# Patient Record
Sex: Female | Born: 1965 | Race: White | Hispanic: No | Marital: Married | Smoking: Never smoker
Health system: Southern US, Community
[De-identification: ages and names within clinical notes are randomized; demographics above are authoritative.]

## PROBLEM LIST (undated history)

## (undated) DIAGNOSIS — M797 Fibromyalgia: Secondary | ICD-10-CM

## (undated) DIAGNOSIS — M47812 Spondylosis without myelopathy or radiculopathy, cervical region: Secondary | ICD-10-CM

## (undated) DIAGNOSIS — F32A Depression, unspecified: Secondary | ICD-10-CM

## (undated) DIAGNOSIS — I7300 Raynaud's syndrome without gangrene: Secondary | ICD-10-CM

## (undated) DIAGNOSIS — N6019 Diffuse cystic mastopathy of unspecified breast: Secondary | ICD-10-CM

## (undated) DIAGNOSIS — M1990 Unspecified osteoarthritis, unspecified site: Secondary | ICD-10-CM

## (undated) DIAGNOSIS — F329 Major depressive disorder, single episode, unspecified: Secondary | ICD-10-CM

## (undated) DIAGNOSIS — I10 Essential (primary) hypertension: Secondary | ICD-10-CM

## (undated) DIAGNOSIS — K635 Polyp of colon: Secondary | ICD-10-CM

## (undated) HISTORY — DX: Depression, unspecified: F32.A

## (undated) HISTORY — DX: Major depressive disorder, single episode, unspecified: F32.9

## (undated) HISTORY — DX: Raynaud's syndrome without gangrene: I73.00

## (undated) HISTORY — PX: LAPAROTOMY: SHX154

## (undated) HISTORY — DX: Diffuse cystic mastopathy of unspecified breast: N60.19

## (undated) HISTORY — DX: Essential (primary) hypertension: I10

## (undated) HISTORY — DX: Polyp of colon: K63.5

## (undated) HISTORY — DX: Spondylosis without myelopathy or radiculopathy, cervical region: M47.812

## (undated) HISTORY — PX: WRIST SURGERY: SHX841

## (undated) HISTORY — PX: MYOMECTOMY: SHX85

## (undated) HISTORY — DX: Unspecified osteoarthritis, unspecified site: M19.90

## (undated) HISTORY — DX: Fibromyalgia: M79.7

---

## 1998-03-16 ENCOUNTER — Other Ambulatory Visit: Admission: RE | Admit: 1998-03-16 | Discharge: 1998-03-16 | Payer: Self-pay | Admitting: Obstetrics and Gynecology

## 1999-03-20 ENCOUNTER — Other Ambulatory Visit: Admission: RE | Admit: 1999-03-20 | Discharge: 1999-03-20 | Payer: Self-pay | Admitting: Obstetrics and Gynecology

## 1999-04-06 ENCOUNTER — Emergency Department (HOSPITAL_COMMUNITY): Admission: EM | Admit: 1999-04-06 | Discharge: 1999-04-06 | Payer: Self-pay | Admitting: Emergency Medicine

## 1999-04-06 ENCOUNTER — Encounter: Payer: Self-pay | Admitting: Emergency Medicine

## 1999-06-02 ENCOUNTER — Ambulatory Visit (HOSPITAL_COMMUNITY): Admission: RE | Admit: 1999-06-02 | Discharge: 1999-06-02 | Payer: Self-pay | Admitting: Orthopedic Surgery

## 1999-06-02 ENCOUNTER — Encounter: Payer: Self-pay | Admitting: Orthopedic Surgery

## 1999-07-07 ENCOUNTER — Ambulatory Visit (HOSPITAL_BASED_OUTPATIENT_CLINIC_OR_DEPARTMENT_OTHER): Admission: RE | Admit: 1999-07-07 | Discharge: 1999-07-07 | Payer: Self-pay | Admitting: Orthopedic Surgery

## 1999-08-24 ENCOUNTER — Encounter: Admission: RE | Admit: 1999-08-24 | Discharge: 1999-08-24 | Payer: Self-pay | Admitting: Internal Medicine

## 1999-08-24 ENCOUNTER — Encounter: Payer: Self-pay | Admitting: Internal Medicine

## 2000-03-20 ENCOUNTER — Other Ambulatory Visit: Admission: RE | Admit: 2000-03-20 | Discharge: 2000-03-20 | Payer: Self-pay | Admitting: Obstetrics and Gynecology

## 2001-02-14 ENCOUNTER — Encounter: Payer: Self-pay | Admitting: Emergency Medicine

## 2001-02-14 ENCOUNTER — Emergency Department (HOSPITAL_COMMUNITY): Admission: EM | Admit: 2001-02-14 | Discharge: 2001-02-14 | Payer: Self-pay | Admitting: Emergency Medicine

## 2001-04-01 ENCOUNTER — Other Ambulatory Visit: Admission: RE | Admit: 2001-04-01 | Discharge: 2001-04-01 | Payer: Self-pay | Admitting: Obstetrics and Gynecology

## 2001-04-03 ENCOUNTER — Encounter: Admission: RE | Admit: 2001-04-03 | Discharge: 2001-04-03 | Payer: Self-pay | Admitting: Obstetrics and Gynecology

## 2001-04-03 ENCOUNTER — Encounter: Payer: Self-pay | Admitting: Obstetrics and Gynecology

## 2002-04-10 ENCOUNTER — Other Ambulatory Visit: Admission: RE | Admit: 2002-04-10 | Discharge: 2002-04-10 | Payer: Self-pay | Admitting: Obstetrics and Gynecology

## 2002-07-16 ENCOUNTER — Encounter: Payer: Self-pay | Admitting: Internal Medicine

## 2002-07-16 ENCOUNTER — Encounter: Admission: RE | Admit: 2002-07-16 | Discharge: 2002-07-16 | Payer: Self-pay | Admitting: Internal Medicine

## 2003-04-28 ENCOUNTER — Other Ambulatory Visit: Admission: RE | Admit: 2003-04-28 | Discharge: 2003-04-28 | Payer: Self-pay | Admitting: Obstetrics and Gynecology

## 2004-05-09 ENCOUNTER — Other Ambulatory Visit: Admission: RE | Admit: 2004-05-09 | Discharge: 2004-05-09 | Payer: Self-pay | Admitting: Obstetrics and Gynecology

## 2005-08-14 ENCOUNTER — Emergency Department (HOSPITAL_COMMUNITY): Admission: EM | Admit: 2005-08-14 | Discharge: 2005-08-14 | Payer: Self-pay | Admitting: Emergency Medicine

## 2005-08-23 ENCOUNTER — Other Ambulatory Visit: Admission: RE | Admit: 2005-08-23 | Discharge: 2005-08-23 | Payer: Self-pay | Admitting: Obstetrics and Gynecology

## 2005-11-05 HISTORY — PX: LAPAROSCOPIC VAGINAL HYSTERECTOMY: SUR798

## 2006-04-02 ENCOUNTER — Encounter: Admission: RE | Admit: 2006-04-02 | Discharge: 2006-04-02 | Payer: Self-pay | Admitting: Internal Medicine

## 2007-01-06 ENCOUNTER — Encounter: Admission: RE | Admit: 2007-01-06 | Discharge: 2007-01-06 | Payer: Self-pay | Admitting: Obstetrics and Gynecology

## 2007-02-20 ENCOUNTER — Ambulatory Visit (HOSPITAL_COMMUNITY): Admission: RE | Admit: 2007-02-20 | Discharge: 2007-02-21 | Payer: Self-pay | Admitting: Obstetrics and Gynecology

## 2007-02-20 ENCOUNTER — Encounter (INDEPENDENT_AMBULATORY_CARE_PROVIDER_SITE_OTHER): Payer: Self-pay | Admitting: Specialist

## 2007-11-18 ENCOUNTER — Emergency Department (HOSPITAL_COMMUNITY): Admission: EM | Admit: 2007-11-18 | Discharge: 2007-11-19 | Payer: Self-pay | Admitting: Emergency Medicine

## 2008-01-09 ENCOUNTER — Encounter: Admission: RE | Admit: 2008-01-09 | Discharge: 2008-01-09 | Payer: Self-pay | Admitting: Obstetrics and Gynecology

## 2009-01-11 ENCOUNTER — Encounter: Admission: RE | Admit: 2009-01-11 | Discharge: 2009-01-11 | Payer: Self-pay | Admitting: Obstetrics and Gynecology

## 2009-11-21 IMAGING — CR DG FOOT 2V*L*
2 series · 2 of 2 positions shown · non-contrast
Comparison: none

CLINICAL DATA: Ankle and foot twisting injury.  Pain. 
 LEFT ANKLE - 3 VIEW:

[t foot ap left *]
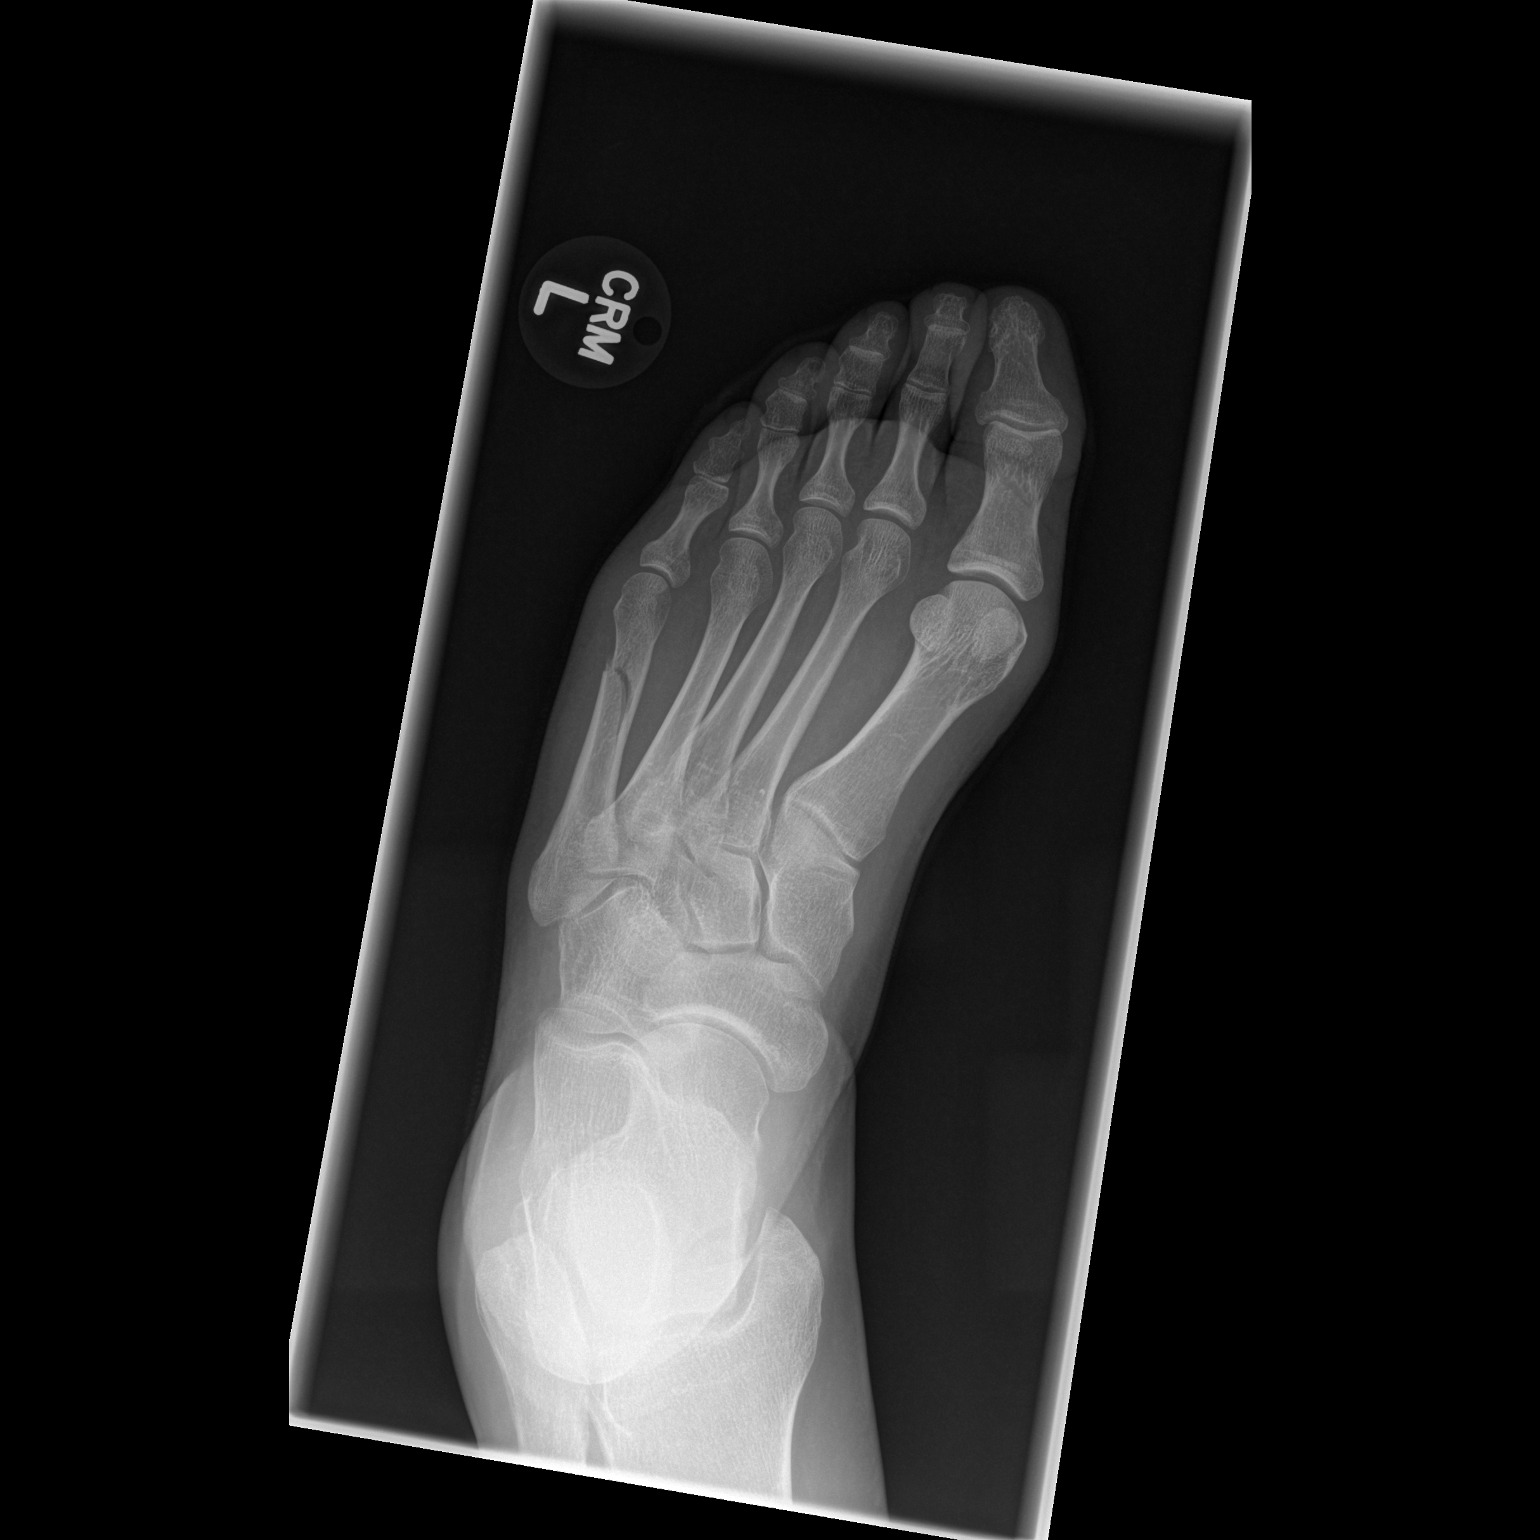

[t foot lat left]
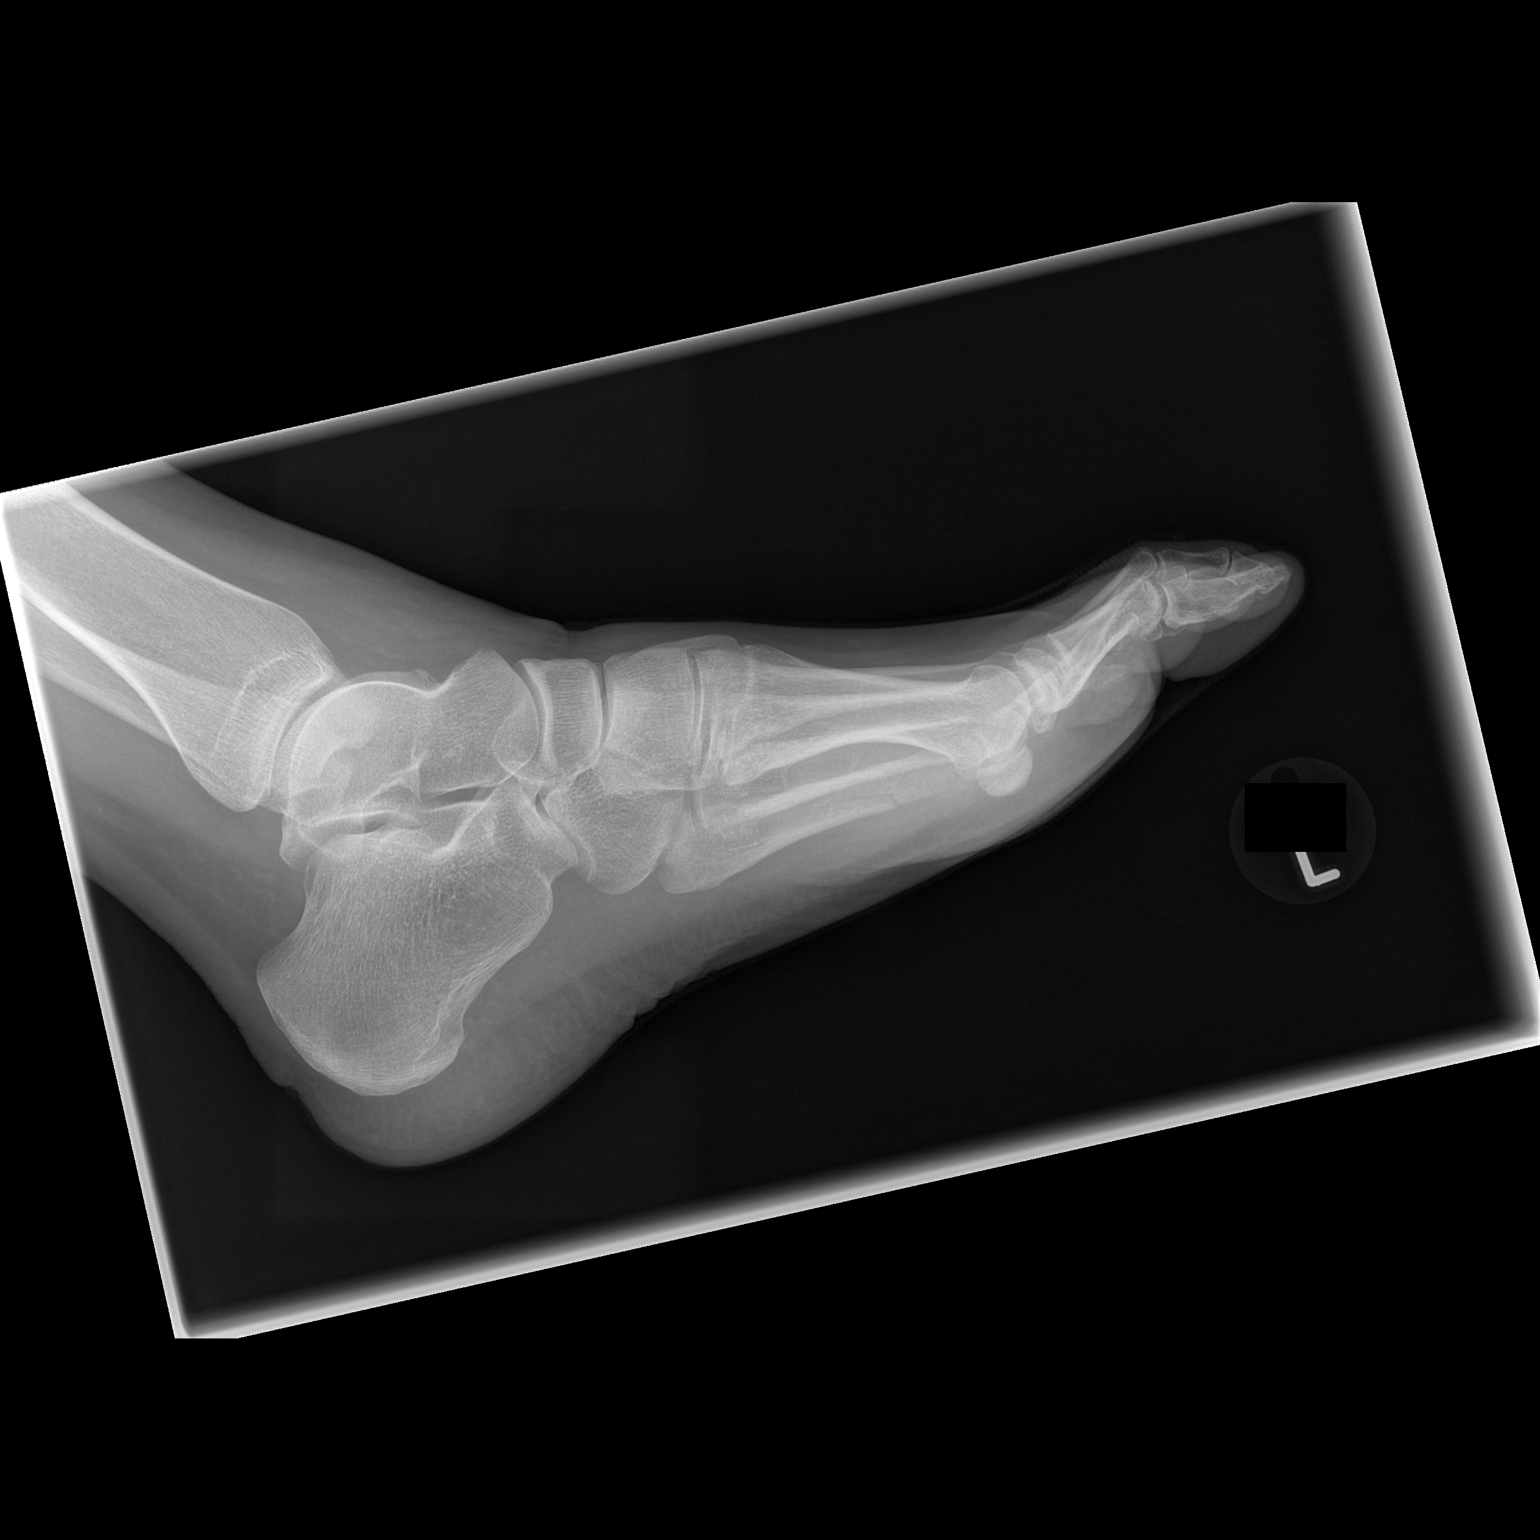

[2 of 2 positions shown; findings below may reference images not displayed]

FINDINGS: Soft tissue swelling is seen along the anterior aspect of the ankle joint, but there is no evidence of fracture or dislocation.  There is no evidence of ankle joint effusion.
IMPRESSION: Anterolateral soft tissue swelling.  No evidence of ankle fracture or dislocation. 
 LEFT FOOT - 2 VIEW:
FINDINGS: A minimally displaced fracture of the distal 5th metatarsal shaft is seen.  No other fractures are identified, and alignment of the bones remains normal.
IMPRESSION: Mildly displaced fracture of the distal 5th metatarsal shaft.

## 2010-10-27 ENCOUNTER — Ambulatory Visit: Payer: Self-pay

## 2010-11-13 ENCOUNTER — Encounter
Admission: RE | Admit: 2010-11-13 | Discharge: 2010-11-13 | Payer: Self-pay | Source: Home / Self Care | Attending: Obstetrics and Gynecology | Admitting: Obstetrics and Gynecology

## 2010-12-15 ENCOUNTER — Encounter: Payer: Self-pay | Admitting: Internal Medicine

## 2011-02-06 NOTE — Procedures (Signed)
Summary: Summary Report  Summary Report   Imported By: Erle Crocker 02/02/2011 10:36:26  _____________________________________________________________________  External Attachment:    Type:   Image     Comment:   External Document

## 2011-03-23 NOTE — H&P (Signed)
NAME:  Natasha Stephens, Natasha Stephens               ACCOUNT NO.:  [id number]   MEDICAL RECORD NO.:  [id number]          PATIENT TYPE:  AMB   LOCATION:  SDC                           FACILITY:  [facility]   PHYSICIAN:  Guy Sandifer. Henderson Cloud, M.D. DATE OF BIRTH:  05/14/1966   DATE OF ADMISSION:  DATE OF DISCHARGE:                              HISTORY & PHYSICAL   CHIEF COMPLAINT:  Heavy crampy menses.   HISTORY OF PRESENT ILLNESS:  This patient is a 45 year old married white  female, G0, P0, with a history of uterine myomectomy in 1996, who is  having current heavy painful menses.  It has been going on for months  and gets progressively heavier.  Ultrasound in my office on December 25, 2006 reveals uterus measuring 8.1 x 4.3 x 5.8 cm.  There is a 6-cm  subserosal fibroid on the uterus.  Left ovary had a 1.6-cm simple cyst.  After careful discussion of the options, patient is being admitted for  laparoscopically-assisted vaginal hysterectomy.  Patient also requests  bilateral salpingo-oophorectomy.  The issues of menopause and possible  treatments have been carefully discussed with the patient.  She would  like both ovaries removed.  Potential risks and complications of the  surgery have also been reviewed.  Patient is a TE[employer] teacher [employer] and  refuses any blood products, although she would accept blood expanders.   PAST MEDICAL HISTORY:  1. Elevated prolactin with a normal MRI of the sella.  2. Endometriosis.  3. History of leiomyomata.  4. Fibrocystic breast change.  5. History of migraine headache.  6. Hypertension.  7. History of depression.   PAST SURGICAL HISTORY:  Abdominal uterine myomectomy 1996, obstetric  history negative.   FAMILY HISTORY:  Positive for coronary artery disease in father.   MEDICATIONS:  Maxzide 25 mg daily.   ALLERGIES:  NO KNOWN DRUG ALLERGIES.   SOCIAL HISTORY:  Denies tobacco, alcohol or drug abuse.   REVIEW OF SYSTEMS:  NEURO:  History of headache as above.  CARDIAC:  Denies chest pain.  PULMONARY:  Denies shortness of breath.  GI:  Denies  recent changes in bowel habits.   PHYSICAL EXAMINATION:  VITAL SIGNS:  Height 5 feet 7 inches, weight  157.2 pounds, blood pressure 114/90.  HEENT:  Without thyromegaly.  LUNGS:  Clear to auscultation.  HEART:  Regular rate and rhythm.  BACK:  Without CVA tenderness.  BREASTS:  Without masses, tracts or discharge.  ABDOMEN:  Soft, nontender without masses.  PELVIC:  Vulva, vagina, cervix without lesion.  Uterus is retroverted,  irregular contour, about 8 weeks' size, nontender.  Adnexa nontender  without masses.  EXTREMITIES: Grossly within normal limits.  NEUROLOGICAL:  Grossly within normal limits.   ASSESSMENT:  1. Recurrent leiomyomata.  2. Menorrhagia.   PLAN:  Laparoscopically-assisted vaginal hysterectomy with bilateral  salpingo-oophorectomy.      Guy Sandifer Henderson Cloud, M.D.  Electronically Signed     JET/MEDQ  D:  02/17/2007  T:  02/17/2007  Job:  [id number]

## 2011-03-23 NOTE — Discharge Summary (Signed)
NAME:  Natasha Stephens, Natasha Stephens               ACCOUNT NO.:  [id number]   MEDICAL RECORD NO.:  [id number]          PATIENT TYPE:  OIB   LOCATION:  9304                          FACILITY:  [facility]   PHYSICIAN:  Guy Sandifer. Henderson Cloud, M.D. DATE OF BIRTH:  11-Sep-1966   DATE OF ADMISSION:  02/20/2007  DATE OF DISCHARGE:  02/21/2007                               DISCHARGE SUMMARY   ADMITTING DIAGNOSES:  1. Uterine leiomyomata.  2. Menorrhagia.   DISCHARGE DIAGNOSES:  1. Uterine leiomyomata.  2. Menorrhagia.   PROCEDURE:  On 02/20/2007, laparoscopically assisted vaginal  hysterectomy with bilateral salpingo-oophorectomy.   REASON FOR ADMISSION:  This patient is a 45 year old married white  female, G0, P0 with a history of uterine myomectomy in 1996.  She has  recurrent heavy vaginal bleeding.  Details are dictated in the history  and physical.   HOSPITAL COURSE:  The patient was taken to the hospital and undergoes  the above procedure.  On the evening of surgery, she is ambulating and  tolerating a regular diet with good pain control.  Vital signs are  stable and she is afebrile with clear urine output.  On the day of  discharge, she continues to be ambulatory, feeling good and tolerating a  regular diet.  She is afebrile.  Hemoglobin is 11.9 and pathology is  pending.   CONDITION ON DISCHARGE:  Good.   DIET:  Regular, as tolerated.   ACTIVITY:  No lifting.  No operation of automobiles.  No vaginal entry.   She is to call the office for problems including, but not limited to  temperature of 101 degrees, increasing pain, persistent nausea and  vomiting or heavy vaginal bleeding.   MEDICATIONS:  1. Percocet 5/325 mg, #40, 1-2 p.o. q.6h. p.r.n. and no refills.  2. Ibuprofen 600 mg q.6h. p.r.n.  3. Multivitamins p.r.n.   FOLLOW UP:  In the office in 2 weeks.      Guy Sandifer Henderson Cloud, M.D.  Electronically Signed     JET/MEDQ  D:  02/21/2007  T:  02/21/2007  Job:  [id number]

## 2011-03-23 NOTE — Op Note (Signed)
NAME:  Natasha Stephens, Natasha Stephens               ACCOUNT NO.:  [id number]   MEDICAL RECORD NO.:  [id number]          PATIENT TYPE:  AMB   LOCATION:  SDC                           [facility]ILITY:  [facility]   PHYSICIAN:  Guy Sandifer. Henderson Cloud, M.D. DATE OF BIRTH:  Sep 02, 1966   DATE OF PROCEDURE:  02/20/2007  DATE OF DISCHARGE:                               OPERATIVE REPORT   PREOPERATIVE DIAGNOSIS:  Uterine leiomyomata and menorrhagia.   POSTOPERATIVE DIAGNOSIS:  Uterine leiomyomata and menorrhagia.   PROCEDURE:  Laparoscopically-assisted vaginal hysterectomy with  bilateral salpingo-oophorectomy.   SURGEON:  Harold Hedge, M.D.   ASSISTANT:  Marcelle Overlie M.D.   ANESTHESIA:  General endotracheal intubation.   SPECIMENS:  Uterus, bilateral tubes and ovaries to pathology.   ESTIMATED BLOOD LOSS:  250 mL.   INDICATIONS AND CONSENT:  This patient is a 45 year old married white  female G3, P0 who had uterine myomectomy in 1996.  She has recurrent  uterine leiomyomata and heavy menses.  Details dictated history and  physical.  After discussing options she is being admitted for  laparoscopically-assisted vaginal hysterectomy.  After careful  discussion, the patient requests bilateral salpingo-oophorectomy.  Potential risks and complications have been discussed preoperatively  including but not limited to infection, bowel, bladder, ureteral damage,  bleeding requiring transfusion of blood products with possible  transfusion reaction, HIV and hepatitis acquisition, DVT, PE, pneumonia,  fistula formation, postoperative dyspareunia, laparotomy.  Issues of  menopause have been discussed preoperatively.  All questions were  answered and consent signed on the chart.   FINDINGS:  Upper abdomen is grossly normal.  Uterus has a 6-cm posterior  fundal fibroid.  Anterior posterior cul-de-sacs were normal.  Ovaries  and tubes are normal bilaterally.   PROCEDURE:  The patient is taken to operating room where she is  identified, placed in dorsosupine position and general anesthesia is  induced via endotracheal intubation.  She is then placed in dorsal  lithotomy position where she is prepped abdominally and vaginally.  Bladder straight catheterized.  Hulka tenaculum was placed in uterus as  manipulator and she is draped in sterile fashion.  The infraumbilical  and suprapubic areas were injected with 0.5% plain Marcaine in the  midline.  A small infraumbilical incision is made and a disposable  Veress needle was placed on the first attempt without difficulty.  Placement verified with the syringe and drop test and it was normal.  2  liters of gas are then insufflated under low pressure with good tympany  in the right upper quadrant.  Veress needle was then removed and a 10/11  XL bladeless disposable trocar sleeve was placed using direct  visualization with the diagnostic laparoscopic.  After placement this  was switched to the operative laparoscope.  Small suprapubic incision  was made in the midline and a 5-mm XL bladeless disposable trocar sleeve  was placed under direct visualization without difficulty.  The above  findings noted.  The course the ureter seemed to be well clear of the  area of surgery.  The right infundibulopelvic ligament is taken down  using the gyrus bipolar cautery cutting  instrument.  Progressive bites  taken across the mesosalpinx, round ligament, broad ligament down to the  level of the vesicouterine peritoneum.  Similar procedure is carried out  on the left side.  The vesicouterine peritoneum was taken down  cephalolaterally.  Good hemostasis was noted.  Suprapubic trocar sleeve  was removed.  Instruments are removed and attention is turned to the  vagina.  Posterior cul-de-sac is entered sharply.  Cervix is  circumscribed with the unipolar cautery.  Mucosa was advanced sharply  and bluntly.  Then using the gyrus bipolar cautery instrument the  uterosacral ligaments were taken  down followed by the cardinal ligaments  and the uterine vessels bilaterally.  The fundus is delivered  posteriorly.  The remaining pedicles are taken down and specimens  delivered.  The uterosacral ligaments are then plicated the vagina  bilaterally with separate sutures of 0 Monocryl.  All suture will be 0  Monocryl unless otherwise designated.  Suture was placed on the cuff at  the 3 and 9 o'clock position as well to help assure hemostasis.  The  uterosacral ligaments then plicated in the midline with a third suture  and the cuff was then closed with figure-of-eights.  Foley catheter is  placed in the bladder and clear urine is noted.  Attention was returned  to the abdomen.  Pneumoperitoneum is reintroduced and the suprapubic 5-  mm bladeless trocar sleeve was placed under direct visualization without  difficulty.  Minor bleeding at peritoneal edges is controlled with  bipolar cautery.  Copious irrigation is carried out and all return is  clear.  Inspection under reduced pneumoperitoneum reveals continued  hemostasis.  Suprapubic trocar sleeve was removed.  Pneumoperitoneum is  reduced and umbilical trocar sleeve was removed.  The umbilical incision  is closed with a 0 Vicryl suture in the subcutaneous portion in a single  stitch.  Care was taken not to pick up any underlying structures.  The  skin of the umbilical incision is closed with subcuticular 3-0 Vicryl.  Both incisions were closed with Dermabond.  All counts correct.  The  patient is awakened, taken to recovery room in stable condition.      Guy Sandifer Henderson Cloud, M.D.  Electronically Signed     JET/MEDQ  D:  02/20/2007  T:  02/20/2007  Job:  [id number]

## 2012-01-25 ENCOUNTER — Encounter: Payer: Self-pay | Admitting: Gastroenterology

## 2012-02-06 ENCOUNTER — Encounter: Payer: Self-pay | Admitting: *Deleted

## 2012-02-11 ENCOUNTER — Encounter (HOSPITAL_COMMUNITY): Payer: Self-pay | Admitting: Emergency Medicine

## 2012-02-11 ENCOUNTER — Emergency Department (HOSPITAL_COMMUNITY)
Admission: EM | Admit: 2012-02-11 | Discharge: 2012-02-11 | Disposition: A | Discharge status: Home / Self Care | Payer: 59 | Attending: Emergency Medicine | Admitting: Emergency Medicine

## 2012-02-11 ENCOUNTER — Other Ambulatory Visit: Payer: Self-pay

## 2012-02-11 DIAGNOSIS — R000 Tachycardia, unspecified: Secondary | ICD-10-CM

## 2012-02-11 DIAGNOSIS — I10 Essential (primary) hypertension: Secondary | ICD-10-CM | POA: Insufficient documentation

## 2012-02-11 DIAGNOSIS — I498 Other specified cardiac arrhythmias: Secondary | ICD-10-CM | POA: Insufficient documentation

## 2012-02-11 LAB — BASIC METABOLIC PANEL
BUN: 12 mg/dL (ref 6–23)
CO2: 25 mEq/L (ref 19–32)
Calcium: 10.3 mg/dL (ref 8.4–10.5)
Chloride: 102 mEq/L (ref 96–112)
Creatinine, Ser: 1.01 mg/dL (ref 0.50–1.10)
GFR calc Af Amer: 77 mL/min — ABNORMAL LOW (ref >90)
GFR calc non Af Amer: 67 mL/min — ABNORMAL LOW (ref >90)
Glucose, Bld: 106 mg/dL — ABNORMAL HIGH (ref 70–99)
Potassium: 4.2 mEq/L (ref 3.5–5.1)
Sodium: 141 mEq/L (ref 135–145)

## 2012-02-11 LAB — CBC
HCT: 43.7 % (ref 36.0–46.0)
Hemoglobin: 15.3 g/dL — ABNORMAL HIGH (ref 12.0–15.0)
MCH: 30.7 pg (ref 26.0–34.0)
MCHC: 35 g/dL (ref 30.0–36.0)
MCV: 87.6 fL (ref 78.0–100.0)
Platelets: 304 10*3/uL (ref 150–400)
RBC: 4.99 MIL/uL (ref 3.87–5.11)
RDW: 12.4 % (ref 11.5–15.5)
WBC: 8 10*3/uL (ref 4.0–10.5)

## 2012-02-11 LAB — TSH: TSH: 0.585 u[IU]/mL (ref 0.350–4.500)

## 2012-02-11 MED ORDER — SODIUM CHLORIDE 0.9 % IV BOLUS (SEPSIS)
1000.0000 mL | Freq: Once | INTRAVENOUS | Status: AC
  Administered 2012-02-11: 1000 mL via INTRAVENOUS

## 2012-02-11 NOTE — Discharge Instructions (Signed)
Tachycardia, Nonspecific In adults, the heart normally beats between 60 and 100 times a minute. A heart rate over 100 is called tachycardia. When your heart beats too fast, it Hajjar not be able to pump enough blood to the rest of the body. CAUSES   Exercise or exertion.   Fever.   Pain or injury.   Infection.   Loss of fluid (dehydration).   Overactive thyroid.   Lack of red blood cells (anemia).   Anxiety.   Alcohol.   Heart arrhythmia.   Caffeine.   Tobacco products.   Diet pills.   Street drugs.   Heart disease.  SYMPTOMS  Palpitations (rapid or irregular heartbeat).   Dizziness.   Tiredness (fatigue).   Shortness of breath.  DIAGNOSIS  After an exam and taking a history, your caregiver Arduini order:  Blood tests.   Electrocardiogram (EKG).   Heart monitor.  TREATMENT  Treatment will depend on the cause and potential for harm. It Thelen include:  Intravenous (IV) replacement of fluids or blood.   Antidote or reversal medicines.   Changes in your present medicines.   Lifestyle changes.  HOME CARE INSTRUCTIONS   Get rest.   Drink enough water and fluids to keep your urine clear or pale yellow.   Avoid:   Caffeine.   Nicotine.   Alcohol.   Stress.   Chocolate.   Stimulants.   Only take medicine as directed by your caregiver.  SEEK IMMEDIATE MEDICAL CARE IF:   You have pain in your chest, upper arms, jaw, or neck.   You become weak, dizzy, or feel faint.   You have palpitations that will not go away.   You throw up (vomit), have diarrhea, or pass blood.   You look pale and your skin is cool and wet.  MAKE SURE YOU:   Understand these instructions.   Will watch your condition.   Will get help right away if you are not doing well or get worse.  Document Released: 11/29/2004 Document Revised: 10/11/2011 Document Reviewed: 10/02/2011 [facility] Patient Information 2012 [place], [place].Hypertension Hypertension is another name for  high blood pressure. High blood pressure Labarre mean that your heart needs to work harder to pump blood. Blood pressure consists of two numbers, which includes a higher number over a lower number (example: 110/72). HOME CARE   Make lifestyle changes as told by your doctor. This Stimmel include weight loss and exercise.   Take your blood pressure medicine every day.   Limit how much salt you use.   Stop smoking if you smoke.   Do not use drugs.   Talk to your doctor if you are using decongestants or birth control pills. These medicines might make blood pressure higher.   Females should not drink more than 1 alcoholic drink per day. Males should not drink more than 2 alcoholic drinks per day.   See your doctor as told.  GET HELP RIGHT AWAY IF:   You have a blood pressure reading with a top number of 180 or higher.   You get a very bad headache.   You get blurred or changing vision.   You feel confused.   You feel weak, numb, or faint.   You get chest or belly (abdominal) pain.   You throw up (vomit).   You cannot breathe very well.  MAKE SURE YOU:   Understand these instructions.   Will watch your condition.   Will get help right away if you are not doing well  or get worse.  Document Released: 04/09/2008 Document Revised: 10/11/2011 Document Reviewed: 04/09/2008 [facility] Patient Information 2012 [place], [place].

## 2012-02-11 NOTE — ED Provider Notes (Signed)
History    46 year old female presenting with hypertension and rapid heart rate. Patient states that she checked her blood pressure today was noted to be high. Heart rate alarm on machine went off as well. Called PCP who recommended coming to the emergency room. Patient states a vague feeling of not feeling right which is what prompted her to check her blood pressure.  Also sensation of burning in her epigastric and lower sternal region. She attributes this to GERD. Upcoming GI appointment. No fevers or chills. No shortness of breath. No nausea or vomiting. No unusual leg pain or swelling. Denies history of blood clot. Denies exogenous estrogen use. Nursing procedures. No recent prolonged immobilization. Patient has a history of hypertension and reports compliance with her medicines. No recent meds changes. No headaches. No acute visual changes. No urinary complaints.  CSN: [id number]  Arrival date & time 02/11/12  1353   First MD Initiated Contact with Patient 02/11/12 1632      Chief Complaint  Patient presents with  . Shaking  . Hypertension    (Consider location/radiation/quality/duration/timing/severity/associated sxs/prior treatment) HPI  Past Medical History  Diagnosis Date  . Endometriosis   . Fibrocystic breast changes   . Migraine   . Hypertension   . Depression     Past Surgical History  Procedure Date  . Laparoscopic vaginal hysterectomy     Family History  Problem Relation Age of Onset  . Coronary artery disease Father     History  Substance Use Topics  . Smoking status: Not on file  . Smokeless tobacco: Not on file  . Alcohol Use: Not on file    OB History    Grav Para Term Preterm Abortions TAB SAB Ect Mult Living                  Review of Systems   Review of symptoms negative unless otherwise noted in HPI.     Allergies  Review of patient's allergies indicates no known allergies.  Home Medications   Current Outpatient Rx  Name Route Sig  Dispense Refill  . ASPIRIN EC 81 MG PO TBEC Oral Take 162 mg by mouth daily as needed. For pain    . BUPROPION HCL ER (XL) 300 MG PO TB24 Oral Take 300 mg by mouth daily.    . TRIAMTERENE-HCTZ 37.5-25 MG PO CAPS Oral Take 1 capsule by mouth every morning.      BP 144/96  Pulse 96  Temp(Src) 98.6 F (37 C) (Oral)  Resp 20  SpO2 100%  Physical Exam  Nursing note and vitals reviewed. Constitutional: She is oriented to person, place, and time. She appears well-developed and well-nourished. No distress.  HENT:  Head: Normocephalic and atraumatic.  Right Ear: External ear normal.  Left Ear: External ear normal.  Eyes: Conjunctivae are normal. Pupils are equal, round, and reactive to light. Right eye exhibits no discharge. Left eye exhibits no discharge.  Neck: Normal range of motion. Neck supple.  Cardiovascular: Regular rhythm and normal heart sounds.  Exam reveals no gallop and no friction rub.   No murmur heard.      Tachycardic with a regular rhythm  Pulmonary/Chest: Effort normal and breath sounds normal. No respiratory distress.  Abdominal: Soft. She exhibits no distension. There is no tenderness.  Musculoskeletal: She exhibits no edema and no tenderness.       No lower extremity edema. Legs are symmetric as compared to each other. No calf tenderness. Negative Homans. No overlying skin changes  the  Neurological: She is alert and oriented to person, place, and time. No cranial nerve deficit. She exhibits normal muscle tone. Coordination normal.       Good finger to nose test bilaterally. Gait steady  Skin: Skin is warm. She is not diaphoretic.  Psychiatric: She has a normal mood and affect. Her behavior is normal. Thought content normal.    ED Course  Procedures (including critical care time)  Labs Reviewed  CBC - Abnormal; Notable for the following:    Hemoglobin 15.3 (*)    All other components within normal limits  BASIC METABOLIC PANEL - Abnormal; Notable for the  following:    Glucose, Bld 106 (*)    GFR calc non Af Amer 67 (*)    GFR calc Af Amer 77 (*)    All other components within normal limits  POCT I-STAT TROPONIN I  TSH   No results found. EKG:  Rhythm: Sinus tach Rate: 132 Axis: Normal Intervals: Normal ST segments: Nonspecific ST changes   1. Sinus tachycardia   2. Hypertension       MDM  46 year old female with hypertension. No evidence of acute end organ damage. No indication for emergent blood pressure reduction. EKG with sinus tachycardia. This Knueppel be related to some element of dehydration. Hemoglobin mildly elevated and heart rate improved to normal after a liter of fluids. Consider PE but doubt. Feel that patient is safe for discharge at this time. Will defer medication adjustments to PCP.Return precautions were discussed. TSH sent for benefit of f/u provider given HTN and sinus tach, although hyperthyroid less of clinical concern.      Raeford Razor, MD 02/11/12 [phone]

## 2012-02-11 NOTE — ED Notes (Signed)
See the triage note. Pt alert her skin feels clammy.  Iv placed

## 2012-02-11 NOTE — ED Notes (Signed)
Pt reports feeling BP was high, irregular heartbeat alarm went off, HR was 118, called PCP who told her to come in. Denies CP but sts she feels like something is sitting on her chest, is seeing a gastroenterologist tomorrow for Acid Reflux and is wondering if that could be related

## 2012-02-12 ENCOUNTER — Encounter: Payer: Self-pay | Admitting: Gastroenterology

## 2012-02-12 ENCOUNTER — Ambulatory Visit (INDEPENDENT_AMBULATORY_CARE_PROVIDER_SITE_OTHER): Payer: 59 | Admitting: Gastroenterology

## 2012-02-12 VITALS — BP 116/82 | HR 88 | Ht 66.5 in | Wt 154.2 lb

## 2012-02-12 DIAGNOSIS — K219 Gastro-esophageal reflux disease without esophagitis: Secondary | ICD-10-CM

## 2012-02-12 DIAGNOSIS — K5900 Constipation, unspecified: Secondary | ICD-10-CM

## 2012-02-12 DIAGNOSIS — R1319 Other dysphagia: Secondary | ICD-10-CM

## 2012-02-12 DIAGNOSIS — I7300 Raynaud's syndrome without gangrene: Secondary | ICD-10-CM

## 2012-02-12 MED ORDER — DEXLANSOPRAZOLE 60 MG PO CPDR: 60.0000 mg | DELAYED_RELEASE_CAPSULE | Freq: Every day | ORAL | Status: DC

## 2012-02-12 MED ORDER — LINACLOTIDE 290 MCG PO CAPS: 1.0000 | ORAL_CAPSULE | Freq: Every day | ORAL | Status: DC

## 2012-02-12 MED ORDER — MOVIPREP 100 G PO SOLR: 1.0000 | Freq: Once | ORAL | Status: DC

## 2012-02-12 NOTE — Patient Instructions (Addendum)
Your procedure has been scheduled for 02/29/2012, please follow the seperate instructions.  Take Dexilant samples once a day, samples given no rx sent.  Take Linzess once a day on a empty stomach, samples given and a rx sent to your pharmacy.  Your Movi prep has been sent to your pharmacy.  Today you watched the Genella Rife movie in the office. Follow the gerd diet below.   Gastroesophageal Reflux Disease, Adult Gastroesophageal reflux disease (GERD) happens when acid from your stomach flows up into the esophagus. When acid comes in contact with the esophagus, the acid causes soreness (inflammation) in the esophagus. Over time, GERD Cutshaw create small holes (ulcers) in the lining of the esophagus. CAUSES   Increased body weight. This puts pressure on the stomach, making acid rise from the stomach into the esophagus.   Smoking. This increases acid production in the stomach.   Drinking alcohol. This causes decreased pressure in the lower esophageal sphincter (valve or ring of muscle between the esophagus and stomach), allowing acid from the stomach into the esophagus.   Late evening meals and a full stomach. This increases pressure and acid production in the stomach.   A malformed lower esophageal sphincter.  Sometimes, no cause is found. SYMPTOMS   Burning pain in the lower part of the mid-chest behind the breastbone and in the mid-stomach area. This Halter occur twice a week or more often.   Trouble swallowing.   Sore throat.   Dry cough.   Asthma-like symptoms including chest tightness, shortness of breath, or wheezing.  DIAGNOSIS  Your caregiver Tryon be able to diagnose GERD based on your symptoms. In some cases, X-rays and other tests Wrisley be done to check for complications or to check the condition of your stomach and esophagus. TREATMENT  Your caregiver Spelman recommend over-the-counter or prescription medicines to help decrease acid production. Ask your caregiver before starting or adding any  new medicines.  HOME CARE INSTRUCTIONS   Change the factors that you can control. Ask your caregiver for guidance concerning weight loss, quitting smoking, and alcohol consumption.   Avoid foods and drinks that make your symptoms worse, such as:   Caffeine or alcoholic drinks.   Chocolate.   Peppermint or mint flavorings.   Garlic and onions.   Spicy foods.   Citrus fruits, such as oranges, lemons, or limes.   Tomato-based foods such as sauce, chili, salsa, and pizza.   Fried and fatty foods.   Avoid lying down for the 3 hours prior to your bedtime or prior to taking a nap.   Eat small, frequent meals instead of large meals.   Wear loose-fitting clothing. Do not wear anything tight around your waist that causes pressure on your stomach.   Raise the head of your bed 6 to 8 inches with wood blocks to help you sleep. Extra pillows will not help.   Only take over-the-counter or prescription medicines for pain, discomfort, or fever as directed by your caregiver.   Do not take aspirin, ibuprofen, or other nonsteroidal anti-inflammatory drugs (NSAIDs).  SEEK IMMEDIATE MEDICAL CARE IF:   You have pain in your arms, neck, jaw, teeth, or back.   Your pain increases or changes in intensity or duration.   You develop nausea, vomiting, or sweating (diaphoresis).   You develop shortness of breath, or you faint.   Your vomit is green, yellow, black, or looks like coffee grounds or blood.   Your stool is red, bloody, or black.  These symptoms could  be signs of other problems, such as heart disease, gastric bleeding, or esophageal bleeding. MAKE SURE YOU:   Understand these instructions.   Will watch your condition.   Will get help right away if you are not doing well or get worse.  Document Released: 08/01/2005 Document Revised: 10/11/2011 Document Reviewed: 05/11/2011 [facility] Patient Information 2012 [place], [place].    Diet for GERD or PUD Nutrition therapy can help  ease the discomfort of gastroesophageal reflux disease (GERD) and peptic ulcer disease (PUD).  HOME CARE INSTRUCTIONS   Eat your meals slowly, in a relaxed setting.   Eat 5 to 6 small meals per day.   If a food causes distress, stop eating it for a period of time.  FOODS TO AVOID  Coffee, regular or decaffeinated.   Cola beverages, regular or low calorie.   Tea, regular or decaffeinated.   Pepper.   Cocoa.   High fat foods, including meats.   Butter, margarine, hydrogenated oil (trans fats).   Peppermint or spearmint (if you have GERD).   Fruits and vegetables if not tolerated.   Alcohol.   Nicotine (smoking or chewing). This is one of the most potent stimulants to acid production in the gastrointestinal tract.   Any food that seems to aggravate your condition.  If you have questions regarding your diet, ask your caregiver or a registered dietitian. TIPS  Lying flat Crowl make symptoms worse. Keep the head of your bed raised 6 to 9 inches (15 to 23 cm) by using a foam wedge or blocks under the legs of the bed.   Do not lay down until 3 hours after eating a meal.   Daily physical activity Stong help reduce symptoms.  MAKE SURE YOU:   Understand these instructions.   Will watch your condition.   Will get help right away if you are not doing well or get worse.  Document Released: 10/22/2005 Document Revised: 10/11/2011 Document Reviewed: 09/07/2011 [facility] Patient Information 2012 [place], [place].

## 2012-02-12 NOTE — Progress Notes (Signed)
History of Present Illness:  This is a very pleasant 46 year old Caucasian female referred by Dr. Rayne Du for evaluation of refractory acid reflux symptoms for the last 6 months. Merlean has had regurgitation, burning substernal chest pain radiating into her back, intermittent solid food dysphagia, a globus sensation and constant throat clearing. She was on trial of omeprazole 40 mg a day with mild improvement, but her symptoms returned on discontinuation of this medication. She has Raynaud's phenomenon in her hands, but no other symptoms of collagen vascular disease. She does suffer from cervical spondylosis. Also, this patient has had years of refractory constipation, and has a long history of daily laxative use. There is no history of rectal bleeding, lower abdominal pain, or thyroid dysfunction with recent labs consistent with normal TSH and CBC. Family history is noncontributory. She has had previous hysterectomy. She denies rectal bleeding or melena. She also denies use of alcohol, cigarettes, or NSAIDs. She does have mild essential hypertension and is on daily Dyazide. She uses Wellbutrin XL 300 mg a day for depression.  I have reviewed this patient's present history, medical and surgical past history, allergies and medications.     ROS: The remainder of the 10 point ROS is negative... she does complain of chronic depression, frequent headaches, frequent PVCs, night sweats, chronic insomnia, and some shortness of breath with exertion.     Physical Exam: Blood pressure 116/82, pulse 80 and regular, and BMI 24.52. General well developed well nourished patient in no acute distress, appearing her stated age Eyes PERRLA, no icterus, fundoscopic exam per opthamologist Skin no lesions noted Neck supple, no adenopathy, no thyroid enlargement, no tenderness Chest clear to percussion and auscultation Heart no significant murmurs, gallops or rubs noted Abdomen no hepatosplenomegaly masses or  tenderness, BS normal.  Rectal inspection normal no fissures, or fistulae noted.  No masses or tenderness on digital exam. Stool guaiac negative. There is no rectal mass, or evidence of impaction. Extremities no acute joint lesions, edema, phlebitis or evidence of cellulitis. Neurologic patient oriented x 3, cranial nerves intact, no focal neurologic deficits noted. Psychological mental status normal and normal affect.  Assessment and plan: Severe GERD with probable related esophageal motility disturbance associated with her Raynaud's phenomenon. 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 I placed her on Dexilant 60 mg every morning with standard antireflux maneuvers. Diagnostic endoscopy has been scheduled her convenience, and she Schlottman need esophageal manometry. Her constipation Remmers also be associated with a GI motility disturbance, and I will give her a problem of Linzess 290 mg every morning to be adjusted as needed. Also colonoscopy scheduled at the time of her endoscopic exam. She did see her patient education video on acid reflux and its management. She otherwise is to continue her medications as listed and reviewed per primary care.  No diagnosis found.

## 2012-02-13 ENCOUNTER — Encounter: Payer: 59 | Admitting: Gastroenterology

## 2012-02-29 ENCOUNTER — Ambulatory Visit (AMBULATORY_SURGERY_CENTER): Payer: 59 | Admitting: Gastroenterology

## 2012-02-29 ENCOUNTER — Other Ambulatory Visit: Payer: Self-pay | Admitting: *Deleted

## 2012-02-29 ENCOUNTER — Encounter: Payer: Self-pay | Admitting: Gastroenterology

## 2012-02-29 VITALS — BP 139/85 | HR 78 | Temp 98.1°F | Resp 18 | Ht 66.0 in | Wt 154.0 lb

## 2012-02-29 DIAGNOSIS — Z1211 Encounter for screening for malignant neoplasm of colon: Secondary | ICD-10-CM

## 2012-02-29 DIAGNOSIS — K219 Gastro-esophageal reflux disease without esophagitis: Secondary | ICD-10-CM

## 2012-02-29 DIAGNOSIS — D126 Benign neoplasm of colon, unspecified: Secondary | ICD-10-CM

## 2012-02-29 DIAGNOSIS — K5900 Constipation, unspecified: Secondary | ICD-10-CM

## 2012-02-29 DIAGNOSIS — D1330 Benign neoplasm of unspecified part of small intestine: Secondary | ICD-10-CM

## 2012-02-29 LAB — HM COLONOSCOPY: HM Colonoscopy: NORMAL

## 2012-02-29 MED ORDER — SODIUM CHLORIDE 0.9 % IV SOLN: 500.0000 mL | INTRAVENOUS | Status: DC

## 2012-02-29 MED ORDER — LINACLOTIDE 145 MCG PO CAPS: 1.0000 | ORAL_CAPSULE | Freq: Every day | ORAL | Status: DC

## 2012-02-29 NOTE — Progress Notes (Signed)
Patient did not experience any of the following events: a burn prior to discharge; a fall within the facility; wrong site/side/patient/procedure/implant event; or a hospital transfer or hospital admission upon discharge from the facility. (G8907) Patient did not have preoperative order for IV antibiotic SSI prophylaxis. (G8918)  

## 2012-02-29 NOTE — Patient Instructions (Signed)
YOU HAD AN ENDOSCOPIC PROCEDURE TODAY AT THE [facility]: Refer to the procedure report that was given to you for any specific questions about what was found during the examination.  If the procedure report does not answer your questions, please call your gastroenterologist to clarify.  If you requested that your care partner not be given the details of your procedure findings, then the procedure report has been included in a sealed envelope for you to review at your convenience later.  YOU SHOULD EXPECT: Some feelings of bloating in the abdomen. Passage of more gas than usual.  Walking can help get rid of the air that was put into your GI tract during the procedure and reduce the bloating. If you had a lower endoscopy (such as a colonoscopy or flexible sigmoidoscopy) you Berberich notice spotting of blood in your stool or on the toilet paper. If you underwent a bowel prep for your procedure, then you Whiteaker not have a normal bowel movement for a few days.  DIET: Your first meal following the procedure should be a light meal and then it is ok to progress to your normal diet.  A half-sandwich or bowl of soup is an example of a good first meal.  Heavy or fried foods are harder to digest and Schriever make you feel nauseous or bloated.  Likewise meals heavy in dairy and vegetables can cause extra gas to form and this can also increase the bloating.  Drink plenty of fluids but you should avoid alcoholic beverages for 24 hours.  ACTIVITY: Your care partner should take you home directly after the procedure.  You should plan to take it easy, moving slowly for the rest of the day.  You can resume normal activity the day after the procedure however you should NOT DRIVE or use heavy machinery for 24 hours (because of the sedation medicines used during the test).    SYMPTOMS TO REPORT IMMEDIATELY: A gastroenterologist can be reached at any hour.  During normal business hours, 8:30 AM to 5:00 PM Monday through Friday,  call [phone].  After hours and on weekends, please call the GI answering service at [phone] who will take a message and have the physician on call contact you.   Following lower endoscopy (colonoscopy or flexible sigmoidoscopy):  Excessive amounts of blood in the stool  Significant tenderness or worsening of abdominal pains  Swelling of the abdomen that is new, acute  Fever of 100F or higher  Following upper endoscopy (EGD)  Vomiting of blood or coffee ground material  New chest pain or pain under the shoulder blades  Painful or persistently difficult swallowing  New shortness of breath  Fever of 100F or higher  Black, tarry-looking stools  FOLLOW UP: If any biopsies were taken you will be contacted by phone or by letter within the next 1-3 weeks.  Call your gastroenterologist if you have not heard about the biopsies in 3 weeks.  Our staff will call the home number listed on your records the next business day following your procedure to check on you and address any questions or concerns that you Esteve have at that time regarding the information given to you following your procedure. This is a courtesy call and so if there is no answer at the home number and we have not heard from you through the emergency physician on call, we will assume that you have returned to your regular daily activities without incident.  SIGNATURES/CONFIDENTIALITY: You and/or your care   partner have signed paperwork which will be entered into your electronic medical record.  These signatures attest to the fact that that the information above on your After Visit Summary has been reviewed and is understood.  Full responsibility of the confidentiality of this discharge information lies with you and/or your care-partner.   HANDOUT ON POLYPS. REPEAT EXAM IN 5-10 YEARS DEPENDING ON PATHOLOGY RESULTS ENDOSCOPY WAS NORMAL. BIOPSIES PENDING

## 2012-02-29 NOTE — Op Note (Signed)
[facility] [address] [place], [place]  [address]  COLONOSCOPY PROCEDURE REPORT  PATIENT:  Natasha Stephens, Natasha Stephens  MR#:  [id number] BIRTHDATE:  1966-07-21, 46 yrs. old  GENDER:  female ENDOSCOPIST:  Vania Rea. Jarold Motto, MD, [facility] REF. BY:  Truddie Crumble, M.D. PROCEDURE DATE:  02/29/2012 PROCEDURE:  Colonoscopy with biopsy ASA CLASS:  Class II INDICATIONS:  Routine Risk Screening MEDICATIONS:   propofol (Diprivan) 240 mg IV  DESCRIPTION OF PROCEDURE:   After the risks and benefits and of the procedure were explained, informed consent was obtained. Digital rectal exam was performed and revealed no abnormalities. The LB CF-H180AL [id number] endoscope was introduced through the anus and advanced to the cecum, which was identified by both the appendix and ileocecal valve.  The quality of the prep was excellent, using MoviPrep.  The instrument was then slowly withdrawn as the colon was fully examined. <<PROCEDUREIMAGES>>  FINDINGS:  ULTRASONIC FINDINGS:  There were multiple polyps identified and removed. in the sigmoid colon. MM RECTO-SIGMOID AREA.COLD BIOPSY DONE. This was otherwise a normal examination of the colon.   Retroflexed views in the rectum revealed no abnormalities.    The scope was then withdrawn from the patient and the procedure completed.  COMPLICATIONS:  None ENDOSCOPIC IMPRESSION: 1) Polyps, multiple in the sigmoid colon 2) Otherwise normal examination R/O ADENOMAS RECOMMENDATIONS: 1) Await biopsy results 2) Repeat colonoscopy in 5 years if polyp adenomatous; otherwise 10 years  REPEAT EXAM:  No  ______________________________ Vania Rea. Jarold Motto, MD, [facility]  CC:  n. eSIGNED:   Vania Rea. Jeromie Gainor at 02/29/2012 02:29 PM  Talaga, Josephyne, [id number]

## 2012-02-29 NOTE — Op Note (Signed)
[facility] [address] [place], [place]  [address]  ENDOSCOPY PROCEDURE REPORT  PATIENT:  Natasha Stephens, Natasha Stephens  MR#:  [id number] BIRTHDATE:  July 17, 1966, 46 yrs. old  GENDER:  female  ENDOSCOPIST:  Vania Rea. Jarold Motto, MD, [facility] Referred by:  Truddie Crumble, M.D.  PROCEDURE DATE:  02/29/2012 PROCEDURE:  EGD with biopsy, [address] ASA CLASS:  Class II INDICATIONS:  REFRACTORY GERD AND SUSPECTED ESOPHAGEAL MOTILITY DISORDER.  MEDICATIONS:   There was residual sedation effect present from prior procedure., propofol (Diprivan) 60 mg IV TOPICAL ANESTHETIC:  DESCRIPTION OF PROCEDURE:   After the risks and benefits of the procedure were explained, informed consent was obtained.  The LB GIF-H180 [id number] endoscope was introduced through the mouth and advanced to the second portion of the duodenum.  The instrument was slowly withdrawn as the mucosa was fully examined. <<PROCEDUREIMAGES>>  Normal duodenal folds were noted. SI BIOPSIES DONE  The upper, middle, and distal third of the esophagus were carefully inspected and no abnormalities were noted. The z-line was well seen at the GEJ. The endoscope was pushed into the fundus which was normal including a retroflexed view. The antrum,gastric body, first and second part of the duodenum were unremarkable.    Retroflexed views revealed no abnormalities.    The scope was then withdrawn from the patient and the procedure completed.  COMPLICATIONS:  None  ENDOSCOPIC IMPRESSION: 1) Normal duodenal folds 2) Normal EGD 1.CHRONIC NONEROSIVE GERD,NO BARRETT'S MUCOSA,STRICTURE 2.R/O CELIAC DISEASE RECOMMENDATIONS: 1) Await biopsy results 2) continue current medications  ______________________________ Vania Rea. Jarold Motto, MD, [facility]  CC:  n. eSIGNED:   Vania Rea. Jaden Batchelder at 02/29/2012 02:35 PM  [place]owbray, Giavana, [phone]

## 2012-03-03 ENCOUNTER — Telehealth: Payer: Self-pay | Admitting: *Deleted

## 2012-03-03 NOTE — Telephone Encounter (Signed)
  Follow up Call-  Call back number 02/29/2012  Post procedure Call Back phone  # [phone]  Permission to leave phone message Yes     Patient questions:  Do you have a fever, pain , or abdominal swelling? no Pain Score  0 *  Have you tolerated food without any problems? yes  Have you been able to return to your normal activities? yes  Do you have any questions about your discharge instructions: Diet   no Medications  no Follow up visit  no  Do you have questions or concerns about your Care? no  Actions: * If pain score is 4 or above: No action needed, pain <4.

## 2012-03-04 ENCOUNTER — Encounter: Payer: Self-pay | Admitting: Gastroenterology

## 2013-02-25 ENCOUNTER — Other Ambulatory Visit: Payer: Self-pay | Admitting: Family Medicine

## 2013-05-28 ENCOUNTER — Telehealth: Payer: Self-pay | Admitting: Family Medicine

## 2013-05-28 MED ORDER — METOPROLOL SUCCINATE ER 25 MG PO TB24: ORAL_TABLET | ORAL | Status: DC

## 2013-05-28 NOTE — Telephone Encounter (Signed)
Rx Refilled  

## 2013-06-11 ENCOUNTER — Other Ambulatory Visit: Payer: Self-pay | Admitting: Family Medicine

## 2013-07-03 ENCOUNTER — Telehealth: Payer: Self-pay | Admitting: Family Medicine

## 2013-07-03 NOTE — Telephone Encounter (Signed)
States has been having episode where she feels her heart racing.  Appt made for her to see provider next week

## 2013-07-07 ENCOUNTER — Ambulatory Visit: Payer: Self-pay | Admitting: Family Medicine

## 2013-07-09 ENCOUNTER — Ambulatory Visit (INDEPENDENT_AMBULATORY_CARE_PROVIDER_SITE_OTHER): Payer: 59 | Admitting: Family Medicine

## 2013-07-09 ENCOUNTER — Encounter: Payer: Self-pay | Admitting: Family Medicine

## 2013-07-09 VITALS — BP 128/74 | HR 78 | Temp 98.3°F | Resp 14 | Wt 164.0 lb

## 2013-07-09 DIAGNOSIS — R000 Tachycardia, unspecified: Secondary | ICD-10-CM

## 2013-07-09 NOTE — Progress Notes (Signed)
Subjective:    Patient ID: Natasha Stephens, female    DOB: Sep 16, 1966, 47 y.o.   MRN: [id number]  HPI Patient has a history of hypertension as well as palpitations. Previous event monitors have revealed PVCs and sinus tachycardia. Therefore she is currently taking Toprol-XL 25 mg by mouth daily. This has worked well for over a year. She's had no episodes of palpitations or tachycardia. Her blood pressure is well controlled. She had an episode 4 days ago where her heart rate reached 120 and stayed in that range for 20-30 minutes. There were no triggers. She had not drank more caffeine that day. She was slightly dehydrated. Otherwise she felt well. The symptoms resolved on there own. She's also had some mild disequilibrium recently with one episode of vertigo related to turning her head. Past Medical History  Diagnosis Date  . Endometriosis   . Fibrocystic breast changes   . Migraine   . Hypertension   . Depression   . Arthritis   . Fibromyalgia    Current Outpatient Prescriptions on File Prior to Visit  Medication Sig Dispense Refill  . buPROPion (WELLBUTRIN XL) 300 MG 24 hr tablet Take 300 mg by mouth daily.      . metoprolol succinate (TOPROL-XL) 25 MG 24 hr tablet TAKE 1 TABLET DAILY  30 tablet  1   No current facility-administered medications on file prior to visit.   No Known Allergies History   Social History  . Marital Status: Married    Spouse Name: N/A    Number of Children: 0  . Years of Education: N/A   Occupational History  . admin assistant    Social History Main Topics  . Smoking status: Never Smoker   . Smokeless tobacco: Never Used  . Alcohol Use: No  . Drug Use: No  . Sexual Activity: Not on file   Other Topics Concern  . Not on file   Social History Narrative  . No narrative on file      Review of Systems  All other systems reviewed and are negative.       Objective:   Physical Exam  Vitals reviewed. Constitutional: She is oriented to  person, place, and time. She appears well-developed and well-nourished.  Neck: Neck supple. No JVD present. No thyromegaly present.  Cardiovascular: Normal rate, regular rhythm, normal heart sounds and intact distal pulses.  Exam reveals no gallop and no friction rub.   No murmur heard. Pulmonary/Chest: Effort normal and breath sounds normal. No respiratory distress. She has no wheezes. She has no rales. She exhibits no tenderness.  Abdominal: Soft. Bowel sounds are normal. She exhibits no distension and no mass. There is no tenderness. There is no rebound and no guarding.  Lymphadenopathy:    She has no cervical adenopathy.  Neurological: She is alert and oriented to person, place, and time. She has normal reflexes. She displays normal reflexes. No cranial nerve deficit. She exhibits normal muscle tone. Coordination normal.  Skin: Skin is warm. No rash noted. No erythema. No pallor.          Assessment & Plan:  1. Tachycardia Symptoms likely reflect PVCs and sinus tachycardia. It is only happened one time. The patient was otherwise asymptomatic. She does not want to proceed with another event monitor at the present time. Therefore we'll continue Toprol-XL 25 mg by mouth daily. If she has another episode of tachycardia she can take an additional Toprol-XL 25 mg tablet as needed. I also taught the  patient Valsalva maneuvers. If she is symptomatic or this is ineffective she should seek medical attention immediately. At the present time I do not feel she needs to increase her basal dose of Toprol-XL as it has been working extremely well for the last year.  Possibly the patient had mild vertigo. At the present time she is asymptomatic. Her exam is normal. Will not pursue it further unless symptoms return.

## 2013-08-19 ENCOUNTER — Other Ambulatory Visit: Payer: Self-pay | Admitting: Family Medicine

## 2013-09-02 ENCOUNTER — Other Ambulatory Visit: Payer: Self-pay | Admitting: Family Medicine

## 2013-11-08 ENCOUNTER — Other Ambulatory Visit: Payer: Self-pay | Admitting: Family Medicine

## 2013-12-29 ENCOUNTER — Encounter: Payer: 59 | Admitting: Family Medicine

## 2014-01-05 ENCOUNTER — Encounter: Payer: 59 | Admitting: Family Medicine

## 2014-01-27 ENCOUNTER — Other Ambulatory Visit: Payer: BC Managed Care – PPO

## 2014-01-27 DIAGNOSIS — Z0000 Encounter for general adult medical examination without abnormal findings: Secondary | ICD-10-CM

## 2014-01-27 LAB — COMPREHENSIVE METABOLIC PANEL
ALT: 26 U/L (ref 0–35)
AST: 27 U/L (ref 0–37)
Albumin: 4.4 g/dL (ref 3.5–5.2)
Alkaline Phosphatase: 87 U/L (ref 39–117)
BILIRUBIN TOTAL: 0.4 mg/dL (ref 0.2–1.2)
BUN: 14 mg/dL (ref 6–23)
CO2: 29 mEq/L (ref 19–32)
Calcium: 9.2 mg/dL (ref 8.4–10.5)
Chloride: 104 mEq/L (ref 96–112)
Creat: 0.98 mg/dL (ref 0.50–1.10)
GLUCOSE: 82 mg/dL (ref 70–99)
Potassium: 4.6 mEq/L (ref 3.5–5.3)
Sodium: 141 mEq/L (ref 135–145)
Total Protein: 6.5 g/dL (ref 6.0–8.3)

## 2014-01-27 LAB — CBC WITH DIFFERENTIAL/PLATELET
BASOS PCT: 1 % (ref 0–1)
Basophils Absolute: 0 10*3/uL (ref 0.0–0.1)
EOS ABS: 0 10*3/uL (ref 0.0–0.7)
Eosinophils Relative: 1 % (ref 0–5)
HCT: 40.6 % (ref 36.0–46.0)
Hemoglobin: 13.6 g/dL (ref 12.0–15.0)
Lymphocytes Relative: 38 % (ref 12–46)
Lymphs Abs: 1.8 10*3/uL (ref 0.7–4.0)
MCH: 29.6 pg (ref 26.0–34.0)
MCHC: 33.5 g/dL (ref 30.0–36.0)
MCV: 88.3 fL (ref 78.0–100.0)
MONOS PCT: 7 % (ref 3–12)
Monocytes Absolute: 0.3 10*3/uL (ref 0.1–1.0)
Neutro Abs: 2.5 10*3/uL (ref 1.7–7.7)
Neutrophils Relative %: 53 % (ref 43–77)
Platelets: 252 10*3/uL (ref 150–400)
RBC: 4.6 MIL/uL (ref 3.87–5.11)
RDW: 13.6 % (ref 11.5–15.5)
WBC: 4.8 10*3/uL (ref 4.0–10.5)

## 2014-01-27 LAB — LIPID PANEL
CHOL/HDL RATIO: 3.5 ratio
Cholesterol: 191 mg/dL (ref 0–200)
HDL: 55 mg/dL (ref >39)
LDL CALC: 125 mg/dL — AB (ref 0–99)
TRIGLYCERIDES: 55 mg/dL (ref <150)
VLDL: 11 mg/dL (ref 0–40)

## 2014-01-27 LAB — TSH: TSH: 0.841 u[IU]/mL (ref 0.350–4.500)

## 2014-01-28 LAB — VITAMIN D 25 HYDROXY (VIT D DEFICIENCY, FRACTURES): Vit D, 25-Hydroxy: 43 ng/mL (ref 30–89)

## 2014-02-03 ENCOUNTER — Encounter: Payer: Self-pay | Admitting: Family Medicine

## 2014-02-03 ENCOUNTER — Ambulatory Visit (INDEPENDENT_AMBULATORY_CARE_PROVIDER_SITE_OTHER): Payer: BC Managed Care – PPO | Admitting: Family Medicine

## 2014-02-03 VITALS — BP 132/90 | HR 78 | Temp 98.7°F | Resp 16 | Ht 66.5 in | Wt 167.0 lb

## 2014-02-03 DIAGNOSIS — I10 Essential (primary) hypertension: Secondary | ICD-10-CM

## 2014-02-03 MED ORDER — METOPROLOL SUCCINATE ER 25 MG PO TB24: ORAL_TABLET | ORAL | Status: DC

## 2014-02-03 MED ORDER — BUPROPION HCL ER (XL) 300 MG PO TB24: 300.0000 mg | ORAL_TABLET | Freq: Every day | ORAL | Status: DC

## 2014-02-03 NOTE — Progress Notes (Signed)
Subjective:    Patient ID: Natasha Stephens, female    DOB: [birth year], 48 y.o.   MRN: [id number]  HPI Patient is here today for followup of her hypertension. She is currently taking Toprol-XL 25 mg by mouth daily she denies any chest pain, shortness of breath, dyspnea on exertion. Her blood pressure is much better at home chest reports occasional tingling in the bottom of her left foot. She denies any low back pain. She denies any shooting lumbar radiculopathy. Past Medical History  Diagnosis Date  . Endometriosis   . Fibrocystic breast changes   . Migraine   . Hypertension   . Depression   . Arthritis   . Fibromyalgia    No current outpatient prescriptions on file prior to visit.   No current facility-administered medications on file prior to visit.   No Known Allergies History   Social History  . Marital Status: Married    Spouse Name: N/A    Number of Children: 0  . Years of Education: N/A   Occupational History  . admin assistant    Social History Main Topics  . Smoking status: Never Smoker   . Smokeless tobacco: Never Used  . Alcohol Use: No  . Drug Use: No  . Sexual Activity: Not on file   Other Topics Concern  . Not on file   Social History Narrative  . No narrative on file      Review of Systems  All other systems reviewed and are negative.       Objective:   Physical Exam  Vitals reviewed. Cardiovascular: Normal rate, regular rhythm and normal heart sounds.  Exam reveals no gallop and no friction rub.   No murmur heard. Pulmonary/Chest: Effort normal and breath sounds normal.          Assessment & Plan:  1. HTN (hypertension) Patient's most recent lab work is listed below: Lab on 01/27/2014  Component Date Value Ref Range Status  . WBC 01/27/2014 4.8  4.0 - 10.5 K/uL Final  . RBC 01/27/2014 4.60  3.87 - 5.11 MIL/uL Final  . Hemoglobin 01/27/2014 13.6  12.0 - 15.0 g/dL Final  . HCT 01/27/2014 40.6  36.0 - 46.0 % Final  . MCV  01/27/2014 88.3  78.0 - 100.0 fL Final  . MCH 01/27/2014 29.6  26.0 - 34.0 pg Final  . MCHC 01/27/2014 33.5  30.0 - 36.0 g/dL Final  . RDW 01/27/2014 13.6  11.5 - 15.5 % Final  . Platelets 01/27/2014 252  150 - 400 K/uL Final  . Neutrophils Relative % 01/27/2014 53  43 - 77 % Final  . Neutro Abs 01/27/2014 2.5  1.7 - 7.7 K/uL Final  . Lymphocytes Relative 01/27/2014 38  12 - 46 % Final  . Lymphs Abs 01/27/2014 1.8  0.7 - 4.0 K/uL Final  . Monocytes Relative 01/27/2014 7  3 - 12 % Final  . Monocytes Absolute 01/27/2014 0.3  0.1 - 1.0 K/uL Final  . Eosinophils Relative 01/27/2014 1  0 - 5 % Final  . Eosinophils Absolute 01/27/2014 0.0  0.0 - 0.7 K/uL Final  . Basophils Relative 01/27/2014 1  0 - 1 % Final  . Basophils Absolute 01/27/2014 0.0  0.0 - 0.1 K/uL Final  . Smear Review 01/27/2014 Criteria for review not met   Final  . Sodium 01/27/2014 141  135 - 145 mEq/L Final  . Potassium 01/27/2014 4.6  3.5 - 5.3 mEq/L Final  . Chloride 01/27/2014 104  96 -  112 mEq/L Final  . CO2 01/27/2014 29  19 - 32 mEq/L Final  . Glucose, Bld 01/27/2014 82  70 - 99 mg/dL Final  . BUN 01/27/2014 14  6 - 23 mg/dL Final  . Creat 01/27/2014 0.98  0.50 - 1.10 mg/dL Final  . Total Bilirubin 01/27/2014 0.4  0.2 - 1.2 mg/dL Final  . Alkaline Phosphatase 01/27/2014 87  39 - 117 U/L Final  . AST 01/27/2014 27  0 - 37 U/L Final  . ALT 01/27/2014 26  0 - 35 U/L Final  . Total Protein 01/27/2014 6.5  6.0 - 8.3 g/dL Final  . Albumin 01/27/2014 4.4  3.5 - 5.2 g/dL Final  . Calcium 01/27/2014 9.2  8.4 - 10.5 mg/dL Final  . Cholesterol 01/27/2014 191  0 - 200 mg/dL Final   Comment: ATP III Classification:                                < 200        mg/dL        Desirable                               200 - 239     mg/dL        Borderline High                               >= 240        mg/dL        High                             . Triglycerides 01/27/2014 55  <150 mg/dL Final  . HDL 01/27/2014 55  >39 mg/dL  Final  . Total CHOL/HDL Ratio 01/27/2014 3.5   Final  . VLDL 01/27/2014 11  0 - 40 mg/dL Final  . LDL Cholesterol 01/27/2014 125* 0 - 99 mg/dL Final   Comment:                            Total Cholesterol/HDL Ratio:CHD Risk                                                 Coronary Heart Disease Risk Table                                                                 Men       Women                                   1/2 Average Risk              3.4        3.3  Average Risk              5.0        4.4                                    2X Average Risk              9.6        7.1                                    3X Average Risk             23.4       11.0                          Use the calculated Patient Ratio above and the CHD Risk table                           to determine the patient's CHD Risk.                          ATP III Classification (LDL):                                < 100        mg/dL         Optimal                               100 - 129     mg/dL         Near or Above Optimal                               130 - 159     mg/dL         Borderline High                               160 - 189     mg/dL         High                                > 190        mg/dL         Very High                             . TSH 01/27/2014 0.841  0.350 - 4.500 uIU/mL Final  . Vit D, 25-Hydroxy 01/27/2014 43  30 - 89 ng/mL Final   Comment: This assay accurately quantifies Vitamin D, which is the sum of the                          25-Hydroxy forms of Vitamin D2 and D3.  Studies have shown that the  optimum concentration of 25-Hydroxy Vitamin D is 30 ng/mL or higher.                           Concentrations of Vitamin D between 20 and 29 ng/mL are considered to                          be insufficient and concentrations less than 20 ng/mL are considered                          to be deficient for Vitamin D.   patient's  home blood pressures well controlled. I made no changes in her medications at this time. I believe the patient Natasha Stephens also be developing peripheral neuropathy. It is mild at the present time and she defers workup for now unless symptoms worsen.

## 2014-02-13 ENCOUNTER — Other Ambulatory Visit: Payer: Self-pay | Admitting: Family Medicine

## 2014-02-16 ENCOUNTER — Other Ambulatory Visit: Payer: Self-pay | Admitting: Family Medicine

## 2014-02-16 NOTE — Telephone Encounter (Signed)
Medication refilled per protocol. 

## 2014-02-19 ENCOUNTER — Telehealth: Payer: Self-pay | Admitting: Family Medicine

## 2014-02-19 MED ORDER — METOPROLOL SUCCINATE ER 25 MG PO TB24: 25.0000 mg | ORAL_TABLET | Freq: Every day | ORAL | Status: DC

## 2014-02-19 NOTE — Telephone Encounter (Signed)
RX to mail order per patient request

## 2014-03-25 ENCOUNTER — Encounter: Payer: Self-pay | Admitting: Internal Medicine

## 2014-05-02 ENCOUNTER — Encounter (HOSPITAL_COMMUNITY): Payer: Self-pay | Admitting: Emergency Medicine

## 2014-05-02 ENCOUNTER — Emergency Department (HOSPITAL_COMMUNITY)
Admission: EM | Admit: 2014-05-02 | Discharge: 2014-05-02 | Disposition: A | Discharge status: Home / Self Care | Payer: BC Managed Care – PPO | Source: Home / Self Care | Attending: Emergency Medicine | Admitting: Emergency Medicine

## 2014-05-02 DIAGNOSIS — A6920 Lyme disease, unspecified: Secondary | ICD-10-CM

## 2014-05-02 LAB — CBC
HEMATOCRIT: 42.3 % (ref 36.0–46.0)
HEMOGLOBIN: 14.3 g/dL (ref 12.0–15.0)
MCH: 30.7 pg (ref 26.0–34.0)
MCHC: 33.8 g/dL (ref 30.0–36.0)
MCV: 90.8 fL (ref 78.0–100.0)
Platelets: 253 10*3/uL (ref 150–400)
RBC: 4.66 MIL/uL (ref 3.87–5.11)
RDW: 12.5 % (ref 11.5–15.5)
WBC: 5 10*3/uL (ref 4.0–10.5)

## 2014-05-02 MED ORDER — DOXYCYCLINE HYCLATE 100 MG PO CAPS: 100.0000 mg | ORAL_CAPSULE | Freq: Two times a day (BID) | ORAL | Status: DC

## 2014-05-02 NOTE — ED Provider Notes (Signed)
CSN: [id number]     Arrival date & time 05/02/14  0909 History   First MD Initiated Contact with Patient 05/02/14 [phone]     Chief Complaint  Patient presents with  . Rash  . Puncture Wound   (Consider location/radiation/quality/duration/timing/severity/associated sxs/prior Treatment) HPI  Rash  Location: right upper extremity Duration: 1 day Character: large red raised circles Changes: getting larger with central clearing Treatments tried: none History of similar rash: no Exposure/Infestation Concern: tick bite on left great toe and right popliteal area a few weeks ago; pt also has a small puncture wound on the left foot from stepping on something on the carpet yesterday which is not associated with pain, redness, swelling or drainage  Red Flags: New Drug: none Mucocutaneous Involvement: none Fever/Systemic Illness: none   Past Medical History  Diagnosis Date  . Endometriosis   . Fibrocystic breast changes   . Migraine   . Hypertension   . Depression   . Arthritis   . Fibromyalgia    Past Surgical History  Procedure Laterality Date  . Laparoscopic vaginal hysterectomy  2007  . Wrist surgery      right  . Myomectomy    . Laparotomy     Family History  Problem Relation Age of Onset  . Heart disease Father   . Mitral valve prolapse Brother   . Skin cancer Father     basal and squamous  . Skin cancer Sister    History  Substance Use Topics  . Smoking status: Never Smoker   . Smokeless tobacco: Never Used  . Alcohol Use: No   OB History   Grav Para Term Preterm Abortions TAB SAB Ect Mult Living                 Review of Systems  Constitutional: Negative for fever and chills.  Respiratory: Negative for chest tightness and shortness of breath.   Cardiovascular: Negative for chest pain.  Gastrointestinal: Negative for diarrhea and constipation.  Musculoskeletal: Positive for arthralgias. Negative for joint swelling and neck stiffness.       Chronic  arthralgias, no new one  Skin: Positive for rash.  Neurological: Negative for numbness and headaches.  Hematological: Negative for adenopathy.    Allergies  Review of patient's allergies indicates no known allergies.  Home Medications   Prior to Admission medications   Medication Sig Start Date End Date Taking? Authorizing Provider  buPROPion (WELLBUTRIN XL) 150 MG 24 hr tablet TAKE 2 TABLETS DAILY   Yes Susy Frizzle, MD  metoprolol succinate (TOPROL-XL) 25 MG 24 hr tablet Take 1 tablet (25 mg total) by mouth daily. 02/19/14  Yes Susy Frizzle, MD   BP 153/78  Pulse 83  Temp(Src) 98.8 F (37.1 C) (Oral)  Resp 18  SpO2 100% Physical Exam  Constitutional: She is oriented to person, place, and time. She appears well-developed and well-nourished. No distress.  HENT:  Head: Normocephalic and atraumatic.  Mouth/Throat: Oropharynx is clear and moist. No oropharyngeal exudate.  Eyes: Conjunctivae are normal. Pupils are equal, round, and reactive to light.  Neck: Normal range of motion.  Cardiovascular: Normal rate and regular rhythm.   Pulmonary/Chest: Effort normal and breath sounds normal.  Lymphadenopathy:    She has no cervical adenopathy.  Neurological: She is alert and oriented to person, place, and time. No cranial nerve deficit.  Skin: Skin is warm. Rash noted.     8 cm x 6 cm and 8 cm x 5 cm lesions on  right arm with redness and central clearing consistent with urticaria vs erythema migrans     ED Course  Procedures (including critical care time) Labs Review Labs Reviewed  CBC  B. BURGDORFI ANTIBODIES    Imaging Review No results found.   MDM   1. Erythema migrans (Lyme disease)    Differential of rash includes urticaria vs erythema migrans. Given her multiple tick bites, I will treat presumptively with doxycycline 100 mg by mouth twice a day x14 days. I will check her for B. burgdorferi antibodies as well as a CBC. Additionally she will try  antihistamine and topical steroids in case this is urticaria. 20 precautions for followup.    Angelica Ran, MD 05/02/14 1000

## 2014-05-02 NOTE — ED Notes (Signed)
Best number [phone]

## 2014-05-02 NOTE — ED Notes (Signed)
Patient c/o rash on right forearm onset this morning. Rash is raised and patient reports it seems to be spreading. Patient also reports she stepped on a sharp object with her right foot yesterday. No visible puncture wound. Area is tender and swollen. Patient is alert and oriented and in no acute distress.

## 2014-05-02 NOTE — Discharge Instructions (Signed)
Ms. Edmonds,   I am concerned about the possibility of Lyme disease. Therefore I will start a medication called doxycycline to take twice a day for 14 days. We will check your blood labs and if they are negative, we will call you. Additionally he can try an antihistamine and third treatment prednisone to see if that helps.  Please follow up with your regular doctor this week. Please call the urgent care or emergency department if you develop a worsening illness or rash.  I hope that you feel better.  Dr. Maricela Bo

## 2014-05-03 ENCOUNTER — Telehealth: Payer: Self-pay | Admitting: Family Medicine

## 2014-05-03 LAB — B. BURGDORFI ANTIBODIES: B burgdorferi Ab IgG+IgM: 0.24 {ISR}

## 2014-05-03 NOTE — Telephone Encounter (Signed)
Pt called and informed that CBC WNL and B. burgdoeri antibody low. However, I recommended completing course of doxycycline since it Navratil be too early for body to develop antibodies. Pt agreeable.

## 2014-05-04 NOTE — ED Provider Notes (Signed)
Medical screening examination/treatment/procedure(s) were performed by a resident physician and as supervising physician I was immediately available for consultation/collaboration.  Philipp Deputy, M.D.  Harden Mo, MD 05/04/14 [phone]

## 2014-05-06 ENCOUNTER — Ambulatory Visit (INDEPENDENT_AMBULATORY_CARE_PROVIDER_SITE_OTHER): Payer: BC Managed Care – PPO | Admitting: Physician Assistant

## 2014-05-06 ENCOUNTER — Encounter: Payer: Self-pay | Admitting: Physician Assistant

## 2014-05-06 VITALS — BP 130/78 | HR 68 | Temp 98.5°F | Resp 18 | Ht 65.5 in | Wt 166.0 lb

## 2014-05-06 DIAGNOSIS — A6920 Lyme disease, unspecified: Secondary | ICD-10-CM

## 2014-05-06 DIAGNOSIS — I4949 Other premature depolarization: Secondary | ICD-10-CM

## 2014-05-06 DIAGNOSIS — I493 Ventricular premature depolarization: Secondary | ICD-10-CM | POA: Insufficient documentation

## 2014-05-06 DIAGNOSIS — R000 Tachycardia, unspecified: Secondary | ICD-10-CM

## 2014-05-06 DIAGNOSIS — R609 Edema, unspecified: Secondary | ICD-10-CM | POA: Insufficient documentation

## 2014-05-06 DIAGNOSIS — R002 Palpitations: Secondary | ICD-10-CM

## 2014-05-06 DIAGNOSIS — E8779 Other fluid overload: Secondary | ICD-10-CM

## 2014-05-06 DIAGNOSIS — Z23 Encounter for immunization: Secondary | ICD-10-CM

## 2014-05-06 DIAGNOSIS — I498 Other specified cardiac arrhythmias: Secondary | ICD-10-CM

## 2014-05-06 MED ORDER — HYDROCHLOROTHIAZIDE 12.5 MG PO CAPS: 12.5000 mg | ORAL_CAPSULE | Freq: Every day | ORAL | Status: DC

## 2014-05-06 NOTE — Progress Notes (Signed)
Patient ID: Natasha Stephens MRN: [id number], DOB: 1966/01/04, 48 y.o. Date of Encounter: @DATE @  Chief Complaint:  Chief Complaint  Patient presents with  . Hospitalization Follow-up    Urgent care went sunday, with rash on right arm, is better and little itchy  . tdap vaccination    HPI: 48 y.o. year old white female  Presents for OV to f/u recent U/C visit.   She was seen at the [facility]. urgent care so records are available to me in [place]. She was seen at the urgent care 05/02/14. Reported that she had had a tick bite to the left great toe and the right popliteal area several weeks prior. On exam she had 2 areas on her right arm with diffuse pink erythema with central clearing. She reported that the areas were getting larger with central clearing. Was felt the differential of the rash included urticaria versus erythema migrans. They went ahead and started treatment with doxycycline 100 mg twice a day x14 days. They checked a CBC as well as Lyme titer. CBC was normal. Lyme titer was negative. Patient states that the urgent care has already called her with the results of the Lyme titer. They explained to her that her body Creppel not have produced enough antibodies yet to be detected on the lab test so the lab test isn't necessarily accurate (could be false negative). He told her to go ahead and complete the doxycycline. She is taking the doxycycline as directed. The rash has resolved.  She still has had no fever no increased amount of myalgias no headache no lethargy etc.  Today she says that she has one other question. Says in the past she had been on Maxzide. However she then had problems with increased heart rate so the Maxzide was changed to Toprol. Initially, she was asking whether to change the Toprol back to some type of diuretic. She feels that she is having fluid retention throughout her body. Typically notices it in her fingers and says that her rings are tighter than usual.   Past  Medical History  Diagnosis Date  . Endometriosis   . Fibrocystic breast changes   . Migraine   . Hypertension   . Depression   . Arthritis   . Fibromyalgia      Home Meds: Outpatient Prescriptions Prior to Visit  Medication Sig Dispense Refill  . buPROPion (WELLBUTRIN XL) 150 MG 24 hr tablet TAKE 2 TABLETS DAILY  180 tablet  1  . doxycycline (VIBRAMYCIN) 100 MG capsule Take 1 capsule (100 mg total) by mouth 2 (two) times daily.  28 capsule  0  . metoprolol succinate (TOPROL-XL) 25 MG 24 hr tablet Take 1 tablet (25 mg total) by mouth daily.  90 tablet  3   No facility-administered medications prior to visit.    Allergies: No Known Allergies  History   Social History  . Marital Status: Married    Spouse Name: N/A    Number of Children: 0  . Years of Education: N/A   Occupational History  . admin assistant    Social History Main Topics  . Smoking status: Never Smoker   . Smokeless tobacco: Never Used  . Alcohol Use: No  . Drug Use: No  . Sexual Activity: Not on file   Other Topics Concern  . Not on file   Social History Narrative  . No narrative on file    Family History  Problem Relation Age of Onset  . Heart disease  Father   . Mitral valve prolapse Brother   . Skin cancer Father     basal and squamous  . Skin cancer Sister      Review of Systems:  See HPI for pertinent ROS. All other ROS negative.    Physical Exam: Blood pressure 130/78, pulse 68, temperature 98.5 F (36.9 C), temperature source Oral, resp. rate 18, height 5' 5.5" (1.664 m), weight 166 lb (75.297 kg)., Body mass index is 27.19 kg/(m^2). General: WNWD WF. Appears in no acute distress. Neck: Supple. No thyromegaly. No lymphadenopathy. Lungs: Clear bilaterally to auscultation without wheezes, rales, or rhonchi. Breathing is unlabored. Heart: RRR with S1 S2. No murmurs, rubs, or gallops. Musculoskeletal:  Strength and tone normal for age. Extremities/Skin: Warm and dry. Right Arm now  normal. Rash has resolved.  Neuro: Alert and oriented X 3. Moves all extremities spontaneously. Gait is normal. CNII-XII grossly in tact. Psych:  Responds to questions appropriately with a normal affect.     ASSESSMENT AND PLAN:  48 y.o. year old female with  1. Erythema migrans (Lyme disease) Rash Resolved. Complete the doxycycline.  2. Fluid retention I reviewed Dr. Samella Parr office note dated 07/09/13. He documents the patient has history of hypertension as well as palpitations. Previous event monitors revealed PVCs and sinus tachycardia. Therefore currently taking Toprol-XL 25 mg. Told her that we need to continue the Toprol in order to control the PVCs and sinus tachycardia and palpitations. Discussed adding a low-dose diuretic to this. If this causes lightheadedness then she needs to call me. She is going to take this only on a PRN basis. Told her if she ends up needing to take it on a daily basis or more than 3 days per week then she needs to return for [place] to check BMET. Did note that she had a CMET 01/27/14. Potassium was normal at 4.6. - hydrochlorothiazide (MICROZIDE) 12.5 MG capsule; Take 1 capsule (12.5 mg total) by mouth daily.  Dispense: 30 capsule; Refill: 3  3. Sinus tachycardia seen on cardiac monitor  4. PVCs (premature ventricular contractions)  5. Palpitations   Signed, Olean Ree Partridge, [place], [facility] 05/06/2014 8:35 AM

## 2014-05-15 ENCOUNTER — Emergency Department (HOSPITAL_COMMUNITY)
Admission: EM | Admit: 2014-05-15 | Discharge: 2014-05-15 | Disposition: A | Discharge status: Home / Self Care | Payer: BC Managed Care – PPO | Attending: Emergency Medicine | Admitting: Emergency Medicine

## 2014-05-15 ENCOUNTER — Encounter (HOSPITAL_COMMUNITY): Payer: Self-pay | Admitting: Emergency Medicine

## 2014-05-15 DIAGNOSIS — F329 Major depressive disorder, single episode, unspecified: Secondary | ICD-10-CM | POA: Insufficient documentation

## 2014-05-15 DIAGNOSIS — F3289 Other specified depressive episodes: Secondary | ICD-10-CM | POA: Insufficient documentation

## 2014-05-15 DIAGNOSIS — I10 Essential (primary) hypertension: Secondary | ICD-10-CM | POA: Insufficient documentation

## 2014-05-15 DIAGNOSIS — Z79899 Other long term (current) drug therapy: Secondary | ICD-10-CM | POA: Insufficient documentation

## 2014-05-15 DIAGNOSIS — IMO0002 Reserved for concepts with insufficient information to code with codable children: Secondary | ICD-10-CM | POA: Insufficient documentation

## 2014-05-15 DIAGNOSIS — Z8742 Personal history of other diseases of the female genital tract: Secondary | ICD-10-CM | POA: Insufficient documentation

## 2014-05-15 DIAGNOSIS — G43909 Migraine, unspecified, not intractable, without status migrainosus: Secondary | ICD-10-CM | POA: Insufficient documentation

## 2014-05-15 DIAGNOSIS — R221 Localized swelling, mass and lump, neck: Principal | ICD-10-CM

## 2014-05-15 DIAGNOSIS — Z8739 Personal history of other diseases of the musculoskeletal system and connective tissue: Secondary | ICD-10-CM | POA: Insufficient documentation

## 2014-05-15 DIAGNOSIS — R220 Localized swelling, mass and lump, head: Secondary | ICD-10-CM

## 2014-05-15 DIAGNOSIS — Z792 Long term (current) use of antibiotics: Secondary | ICD-10-CM | POA: Insufficient documentation

## 2014-05-15 MED ORDER — PREDNISONE 20 MG PO TABS: 60.0000 mg | ORAL_TABLET | Freq: Every day | ORAL | Status: DC

## 2014-05-15 MED ORDER — PREDNISONE 20 MG PO TABS
60.0000 mg | ORAL_TABLET | Freq: Once | ORAL | Status: AC
  Administered 2014-05-15: 60 mg via ORAL
  Filled 2014-05-15: qty 3

## 2014-05-15 MED ORDER — PREDNISONE 20 MG PO TABS: 60.0000 mg | ORAL_TABLET | Freq: Once | ORAL | Status: DC

## 2014-05-15 MED ORDER — FAMOTIDINE 20 MG PO TABS
20.0000 mg | ORAL_TABLET | Freq: Once | ORAL | Status: AC
  Administered 2014-05-15: 20 mg via ORAL
  Filled 2014-05-15: qty 1

## 2014-05-15 NOTE — ED Notes (Signed)
Patient refuses wheelchair, walked out to exit by nurse.  Patient unable to e-sign because signature pad is not communicating with computer at this time.  Patient ambulatory with no apparent difficulty, steady gait, nad noted. VSS at discharge

## 2014-05-15 NOTE — ED Provider Notes (Signed)
CSN: [id number]     Arrival date & time 05/15/14  8563 History   First MD Initiated Contact with Patient 05/15/14 0700     Chief Complaint  Patient presents with  . Oral Swelling     (Consider location/radiation/quality/duration/timing/severity/associated sxs/prior Treatment) HPI  This is a 48 year old female with history of hypertension, depression, and fibromyalgia who presents with tongue swelling. Patient states that she woke up this morning and felt like the left side of her tongue was swollen. She took a Benadryl just prior to arrival. She denies any other symptoms including difficulty swallowing, chest pain, shortness of breath, wheezing, rash. She denies any new ingestions, medications, soaps or any other exposures. She denies any use of ACE inhibitors. Patient was evaluated at the end of June and placed on doxycycline empirically for possible Lyme disease. She's finishing up her course today.  Patient feels like her symptoms have improved since this morning.  Past Medical History  Diagnosis Date  . Endometriosis   . Fibrocystic breast changes   . Migraine   . Hypertension   . Depression   . Arthritis   . Fibromyalgia    Past Surgical History  Procedure Laterality Date  . Laparoscopic vaginal hysterectomy  2007  . Wrist surgery      right  . Myomectomy    . Laparotomy     Family History  Problem Relation Age of Onset  . Heart disease Father   . Mitral valve prolapse Brother   . Skin cancer Father     basal and squamous  . Skin cancer Sister    History  Substance Use Topics  . Smoking status: Never Smoker   . Smokeless tobacco: Never Used  . Alcohol Use: No   OB History   Grav Para Term Preterm Abortions TAB SAB Ect Mult Living                 Review of Systems  Constitutional: Negative for fever.  HENT:       Tongue swelling  Respiratory: Negative for chest tightness, shortness of breath and wheezing.   Cardiovascular: Negative for chest pain.   Gastrointestinal: Negative for nausea and vomiting.  Skin: Negative for rash.  All other systems reviewed and are negative.     Allergies  Review of patient's allergies indicates no known allergies.  Home Medications   Prior to Admission medications   Medication Sig Start Date End Date Taking? Authorizing Provider  buPROPion (WELLBUTRIN SR) 150 MG 12 hr tablet Take 300 mg by mouth daily.   Yes Historical Provider, MD  doxycycline (VIBRAMYCIN) 100 MG capsule Take 1 capsule (100 mg total) by mouth 2 (two) times daily. 05/02/14  Yes Angelica Ran, MD  metoprolol succinate (TOPROL-XL) 25 MG 24 hr tablet Take 1 tablet (25 mg total) by mouth daily. 02/19/14  Yes Susy Frizzle, MD  predniSONE (DELTASONE) 20 MG tablet Take 3 tablets (60 mg total) by mouth once. 05/15/14   Merryl Hacker, MD   BP 118/81  Pulse 66  Temp(Src) 98.2 F (36.8 C) (Oral)  Resp 14  Ht 5\' 6"  (1.676 m)  SpO2 97% Physical Exam  Nursing note and vitals reviewed. Constitutional: She is oriented to person, place, and time. She appears well-developed and well-nourished.  HENT:  Head: Normocephalic and atraumatic.  Mouth/Throat: Oropharynx is clear and moist.  No appreciable tongue swelling, uvula midline and without edema, posterior oropharynx normal  Eyes: Pupils are equal, round, and reactive to  light.  Neck: Neck supple.  Left submandibular cervical adenopathy, nontender and mobile  Cardiovascular: Normal rate, regular rhythm and normal heart sounds.   No murmur heard. Pulmonary/Chest: Effort normal and breath sounds normal. No respiratory distress. She has no wheezes.  Abdominal: Soft. There is no tenderness.  Musculoskeletal: She exhibits no edema.  Neurological: She is alert and oriented to person, place, and time.  Skin: Skin is warm and dry. No rash noted.  Psychiatric: She has a normal mood and affect.    ED Course  Procedures (including critical care time) Labs Review Labs Reviewed - No  data to display  Imaging Review No results found.   EKG Interpretation None      MDM   Final diagnoses:  Tongue swelling    Patient presents with sensation of left tongue swelling. On exam there is no appreciable swelling. No associated symptoms including rash or wheezing. Patient reports improvement of her symptoms after Benadryl. Unclear whether this is a possible allergic reaction or early angioedema. Will add Pepcid and prednisone and monitor for a short period of time for continued resolution of symptoms. Discussed treatment course with patient. Patient stated understanding.  After history, exam, and medical workup I feel the patient has been appropriately medically screened and is safe for discharge home. Pertinent diagnoses were discussed with the patient. Patient was given return precautions.     Merryl Hacker, MD 05/15/14 [phone]

## 2014-05-15 NOTE — ED Notes (Signed)
Left tongue swelling. Took x 1 benadryl just in case allergic reaction.  Did experience a ? Bug bite to left foot last week. Had some swelling.

## 2014-05-15 NOTE — ED Notes (Signed)
Dr. Horton at bedside. 

## 2014-05-15 NOTE — Discharge Instructions (Signed)
Angioedema  Angioedema is a sudden swelling of tissues, often of the skin. It can occur on the face or genitals or in the abdomen or other body parts. The swelling usually develops over a short period and gets better in 24 to 48 hours. It often begins during the night and is found when the person wakes up. The person Lembke also get red, itchy patches of skin (hives). Angioedema can be dangerous if it involves swelling of the air passages.   Depending on the cause, episodes of angioedema Milstein only happen once, come back in unpredictable patterns, or repeat for several years and then gradually fade away.   CAUSES   Angioedema can be caused by an allergic reaction to various triggers. It can also result from nonallergic causes, including reactions to drugs, immune system disorders, viral infections, or an abnormal gene that is passed to you from your parents (hereditary). For some people with angioedema, the cause is unknown.   Some things that can trigger angioedema include:   · Foods.    · Medicines, such as ACE inhibitors, ARBs, nonsteroidal anti-inflammatory agents, or estrogen.    · Latex.    · Animal saliva.    · Insect stings.    · Dyes used in X-rays.    · Mild injury.    · Dental work.  · Surgery.  · Stress.    · Sudden changes in temperature.    · Exercise.  SIGNS AND SYMPTOMS   · Swelling of the skin.  · Hives. If these are present, there is also intense itching.  · Redness in the affected area.    · Pain in the affected area.  · Swollen lips or tongue.  · Breathing problems. This Casique happen if the air passages swell.  · Wheezing.  If internal organs are involved, there Holzer be:   · Nausea.    · Abdominal pain.    · Vomiting.    · Difficulty swallowing.    · Difficulty passing urine.  DIAGNOSIS   · Your health care provider will examine the affected area and take a medical and family history.  · Various tests Bigos be done to help determine the cause. Tests Aller include:  ¨ Allergy skin tests to see if the problem  is an allergic reaction.    ¨ Blood tests to check for hereditary angioedema.    ¨ Tests to check for underlying diseases that could cause the condition.    · A review of your medicines, including over the counter medicines, Robbins be done.  TREATMENT   Treatment will depend on the cause of the angioedema. Possible treatments include:   · Removal of anything that triggered the condition (such as stopping certain medicines).    · Medicines to treat symptoms or prevent attacks. Medicines given Lalor include:    ¨ Antihistamines.    ¨ Epinephrine injection.    ¨ Steroids.    · Hospitalization Rowlette be required for severe attacks. If the air passages are affected, it can be an emergency. Tubes Brotzman need to be placed to keep the airway open.  HOME CARE INSTRUCTIONS   · Only take over-the-counter or prescription medicines as directed by your health care provider.  · If you were given medicines for emergency allergy treatment, always carry them with you.  · Wear a medical bracelet as directed by your health care provider.    · Avoid known triggers.  SEEK MEDICAL CARE IF:   · You have repeat attacks of angioedema.    · Your attacks are more frequent or more severe despite preventive measures.    · You have   hereditary angioedema and are considering having children. It is important to discuss the risks of passing the condition on to your children with your health care provider.  SEEK IMMEDIATE MEDICAL CARE IF:   · You have severe swelling of the mouth, tongue, or lips.  · You have difficulty breathing.    · You have difficulty swallowing.    · You faint.  MAKE SURE YOU:  · Understand these instructions.  · Will watch your condition.  · Will get help right away if you are not doing well or get worse.  Document Released: 12/31/2001 Document Revised: 08/12/2013 Document Reviewed: 06/15/2013  ExitCare® Patient Information ©2015 [employer]. This information is not intended to replace advice given to you by your health care provider.  Make sure you discuss any questions you have with your health care provider.

## 2014-05-18 ENCOUNTER — Ambulatory Visit (INDEPENDENT_AMBULATORY_CARE_PROVIDER_SITE_OTHER): Payer: BC Managed Care – PPO | Admitting: Family Medicine

## 2014-05-18 ENCOUNTER — Encounter: Payer: Self-pay | Admitting: Family Medicine

## 2014-05-18 VITALS — BP 130/82 | HR 68 | Temp 98.3°F | Resp 14 | Ht 66.0 in | Wt 168.0 lb

## 2014-05-18 DIAGNOSIS — T7840XA Allergy, unspecified, initial encounter: Secondary | ICD-10-CM

## 2014-05-18 DIAGNOSIS — T148 Other injury of unspecified body region: Secondary | ICD-10-CM

## 2014-05-18 DIAGNOSIS — W57XXXA Bitten or stung by nonvenomous insect and other nonvenomous arthropods, initial encounter: Secondary | ICD-10-CM

## 2014-05-18 MED ORDER — EPINEPHRINE 0.3 MG/0.3ML IJ SOAJ: 0.3000 mg | Freq: Once | INTRAMUSCULAR | Status: DC

## 2014-05-18 NOTE — Progress Notes (Signed)
Subjective:    Patient ID: Natasha Stephens, female    DOB: 17-Aug-1966, 48 y.o.   MRN: [id number]  HPI  Patient was seen June 28 for two circular patches on her ventral right forearm.  She was treated as possible Lyme disease with doxycycline for 14 days. She denies any history of tick bite in that area. She finished the doxycycline 2 days ago.  Over the last week she has developed intermittent welts on her gluteus.  She awoke one morning with swelling in her tongue and went to the emergency room and was given Benadryl and prednisone. She is currently asymptomatic she denies any new ingestions, medications, and possible need allergen she could then expose to (other than doxycycline0. Past Medical History  Diagnosis Date  . Endometriosis   . Fibrocystic breast changes   . Migraine   . Hypertension   . Depression   . Arthritis   . Fibromyalgia    Current Outpatient Prescriptions on File Prior to Visit  Medication Sig Dispense Refill  . buPROPion (WELLBUTRIN SR) 150 MG 12 hr tablet Take 300 mg by mouth daily.      . metoprolol succinate (TOPROL-XL) 25 MG 24 hr tablet Take 1 tablet (25 mg total) by mouth daily.  90 tablet  3  . predniSONE (DELTASONE) 20 MG tablet Take 3 tablets (60 mg total) by mouth daily with breakfast.  12 tablet  0   No current facility-administered medications on file prior to visit.   No Known Allergies History   Social History  . Marital Status: Married    Spouse Name: N/A    Number of Children: 0  . Years of Education: N/A   Occupational History  . admin assistant    Social History Main Topics  . Smoking status: Never Smoker   . Smokeless tobacco: Never Used  . Alcohol Use: No  . Drug Use: No  . Sexual Activity: Not on file   Other Topics Concern  . Not on file   Social History Narrative  . No narrative on file     Review of Systems  All other systems reviewed and are negative.      Objective:   Physical Exam  Vitals  reviewed. Constitutional: She appears well-developed and well-nourished. No distress.  Eyes: Conjunctivae are normal. No scleral icterus.  Neck: Neck supple. No tracheal deviation present.  Cardiovascular: Normal rate, regular rhythm and normal heart sounds.   No murmur heard. Pulmonary/Chest: Effort normal and breath sounds normal. No stridor. No respiratory distress. She has no wheezes. She has no rales.  Abdominal: Soft. Bowel sounds are normal. She exhibits no distension. There is no tenderness. There is no rebound and no guarding.  Lymphadenopathy:    She has no cervical adenopathy.  Skin: Skin is warm. No rash noted. She is not diaphoretic. No erythema. No pallor.          Assessment & Plan:  Allergic reaction, initial encounter - Plan: EPINEPHrine (EPIPEN) 0.3 mg/0.3 mL IJ SOAJ injection  Tick bite - Plan: B. burgdorfi antibodies by WB   Patient's recent symptoms sound like allergic reaction.  Unfortunately I am not sure that the patient could be allergic to. It is possible that she is having some type of delayed sensitivity reaction to doxycycline. Patient brought a picture of a rash on her forearm that was diagnosed as possible Lyme disease. There are 2 red circular patches with central clearing. They appeared either to be urticaria or possibly erythema  chronicum migrans. However the patient denies any tic bite in that area. One of those lesions would have to have been the site of the tick bite and tick would've had to then attached at least 24 hours. I believe she would notice this. Therefore I believe this is unlikely to be Lyme disease. Furthermore, the incidence of multiple sites of EM is less than 10%.  Therefore I believe all the patient's symptoms are due to some unknown allergic trigger.  Check Lyme titers. His Lyme titers are negative I believe we have ruled out Lyme disease. I have started the patient on Claritin 10 mg by mouth daily as a chronic antihistamine. I've also  given her a prescription for an EpiPen. If symptoms do not return the patient can discontinue Claritin in one month. If symptoms do return I would recommend consultation with an allergist for allergy testing.

## 2014-05-19 LAB — B. BURGDORFI ANTIBODIES BY WB
B burgdorferi IgG Abs (IB): NEGATIVE
B burgdorferi IgM Abs (IB): NEGATIVE

## 2014-05-26 ENCOUNTER — Telehealth: Payer: Self-pay | Admitting: Family Medicine

## 2014-05-26 DIAGNOSIS — T7840XA Allergy, unspecified, initial encounter: Secondary | ICD-10-CM

## 2014-05-26 NOTE — Telephone Encounter (Signed)
Message copied by Alyson Locket on Wed May 26, 2014  2:49 PM ------      Message from: Lenore Manner      Created: Wed May 26, 2014 10:34 AM      Regarding: allergic reaction       Contact: [phone]       PT is calling because she had another allergic reaction last night her lips were swelling and she took 2 benaydrls last night that did not help, the swelling has went down. She was told to call back and let him know if this happened again. ------

## 2014-05-27 NOTE — Telephone Encounter (Signed)
Recommend referral to allergist.  Discontinue antihistamine prior to appointment so that they can do allergy testing.

## 2014-05-27 NOTE — Telephone Encounter (Signed)
Pt aware and referral ordered

## 2014-07-13 ENCOUNTER — Encounter: Payer: Self-pay | Admitting: Internal Medicine

## 2014-07-13 ENCOUNTER — Ambulatory Visit (INDEPENDENT_AMBULATORY_CARE_PROVIDER_SITE_OTHER): Payer: BC Managed Care – PPO | Admitting: Internal Medicine

## 2014-07-13 VITALS — BP 110/62 | HR 81 | Ht 66.0 in | Wt 166.0 lb

## 2014-07-13 DIAGNOSIS — L509 Urticaria, unspecified: Secondary | ICD-10-CM

## 2014-07-13 NOTE — Patient Instructions (Addendum)
Hopefully the urticaria/ angioedema is over, so we can watch for now  If needed- start taking an H1 antihistamine: benadryl, claritin, zyrtec, allegra   Once or twice daily                                     An H2 antihistamine: pepcid, zantac or tagamet            Once or twice daily    Suggest you avoid use of ACE inhibitor class antihypertensive meds, even though they are not implicated at this time.  Please call if we can help

## 2014-07-13 NOTE — Assessment & Plan Note (Signed)
Picture of rash on her upper arm looks more like two raised urticarial lesions, rather than "target" of Lyme disease.  Recognizing recent tick bites, it is possible this was a rickettsial infection such as RMSF. Since she is now well, over a month since her last rash, we decided not to try drawing serology. Doxycycline was appropriate regardless. Less likely would be alpha gal IgE sensitization to meat. Plan-we discussed antihistamines available for management of urticaria if needed and can see her back if she has more problems.

## 2014-07-13 NOTE — Progress Notes (Signed)
07/13/14- 74 yoF never smoker FOLLOWS FOR:  allergies.  allergic reaction back in July.  send by Dr. Dennard Schaumann.  Seen in ED  In July w/ tongue swelling. Had just ended a course of doxycycline begun in June for Lyme Disease per Dr Dennard Schaumann. No ACE inhibitors. ED note alluded to hx of HBP, depression, fibromyalgia, also hx sinus tachy. Skin test positive as a teenager for, environmental allergies, but since then has not regarded herself is an allergic person and does not recognize allergic skin or food related problems, asthma or significant rhinitis. About 6 weeks ago she pulled two ticks. 2 or 3 weeks later she developed swelling on the ball of the left foot which resolved. A week or so later she had swelling at the right ankle. In June she will with 2 raised areas of rash on the upper right arm. Based on initial appearance, she was treated at an urgent care for possible Lyme disease with 2 weeks of doxycycline started around June 28. She shows me pictures of the rash on her arm which look more like hives than typical target lesions.about 2 weeks after that she developed hives on her buttocks which fade over 24 hours. She then woke with swelling of left side of tongue and went to emergency room after taking Benadryl. That resolved. 2 weeks later she had swelling of the left upper and lower lip, around the end of July. There have been no further events since then. She was given prescription for EpiPen which he could not afford. She has been under some stress-father died, sister ill and patient is moving.  She denies any chronic infection including hepatitis. She has not had adenopathy, fever, wheezing, arthralgias or abdominal cramps. 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 She works as an Scientist, water quality. No family history of urticaria. Lyme serology negative  Prior to Admission medications   Medication Sig Start Date End Date Taking? Authorizing Provider  buPROPion (WELLBUTRIN SR) 150 MG 12 hr tablet  Take 300 mg by mouth daily.   Yes Historical Provider, MD  EPINEPHrine (EPIPEN) 0.3 mg/0.3 mL IJ SOAJ injection Inject 0.3 mLs (0.3 mg total) into the muscle once. 05/18/14  Yes Susy Frizzle, MD  metoprolol succinate (TOPROL-XL) 25 MG 24 hr tablet Take 1 tablet (25 mg total) by mouth daily. 02/19/14  Yes Susy Frizzle, MD   Past Medical History  Diagnosis Date  . Endometriosis   . Fibrocystic breast changes   . Migraine   . Hypertension   . Depression   . Arthritis   . Fibromyalgia    Past Surgical History  Procedure Laterality Date  . Laparoscopic vaginal hysterectomy  2007  . Wrist surgery      right  . Myomectomy    . Laparotomy     Family History  Problem Relation Age of Onset  . Heart disease Father   . Skin cancer Father     basal and squamous  . Rheum arthritis Father   . Mitral valve prolapse Brother   . Skin cancer Sister    ' History   Social History  . Marital Status: Married    Spouse Name: N/A    Number of Children: 0  . Years of Education: N/A   Occupational History  . admin assistant    Social History Main Topics  . Smoking status: Never Smoker   . Smokeless tobacco: Never Used  . Alcohol Use: No  . Drug Use: No  . Sexual Activity: Not  on file   Other Topics Concern  . Not on file   Social History Narrative  . No narrative on file   ROS-see HPI Constitutional:   No-   weight loss, night sweats, fevers, chills, fatigue, lassitude. HEENT:   No-  headaches, difficulty swallowing, tooth/dental problems, sore throat,       No-  sneezing, itching, ear ache, nasal congestion, post nasal drip,  CV:  No-   chest pain, orthopnea, PND, swelling in lower extremities, anasarca,                                  dizziness, palpitations Resp: No-   shortness of breath with exertion or at rest.              No-   productive cough,  No non-productive cough,  No- coughing up of blood.              No-   change in color of mucus.  No- wheezing.   Skin:  +HPI GI:  No-   heartburn, indigestion, abdominal pain, nausea, vomiting, diarrhea,                 change in bowel habits, loss of appetite GU: No-   dysuria, change in color of urine, no urgency or frequency.  No- flank pain. MS:  No-   joint pain or swelling.  No- decreased range of motion.  No- back pain. Neuro-     nothing unusual Psych:  No- change in mood or affect. No depression or anxiety.  No memory loss.  OBJ- Physical Exam General- Alert, Oriented, Affect-appropriate, Distress- none acute, Looks well Skin- rash-none, lesions- none, excoriation- none Lymphadenopathy- none Head- atraumatic            Eyes- Gross vision intact, PERRLA, conjunctivae and secretions clear            Ears- Hearing, canals-normal            Nose- Clear, no-Septal dev, mucus, polyps, erosion, perforation             Throat- Mallampati II , mucosa clear , drainage- none, tonsils- atrophic Neck- flexible , trachea midline, no stridor , thyroid nl, carotid no bruit Chest - symmetrical excursion , unlabored           Heart/CV- RRR , no murmur , no gallop  , no rub, nl s1 s2                           - JVD- none , edema- none, stasis changes- none, varices- none           Lung- clear to P&A, wheeze- none, cough- none , dullness-none, rub- none           Chest wall-  Abd- tender-no, distended-no, bowel sounds-present, HSM- no Br/ Gen/ Rectal- Not done, not indicated Extrem- cyanosis- none, clubbing, none, atrophy- none, strength- nl Neuro- grossly intact to observation

## 2014-07-22 ENCOUNTER — Other Ambulatory Visit: Payer: Self-pay | Admitting: Family Medicine

## 2014-09-20 ENCOUNTER — Encounter: Payer: Self-pay | Admitting: Physician Assistant

## 2014-09-20 ENCOUNTER — Ambulatory Visit (INDEPENDENT_AMBULATORY_CARE_PROVIDER_SITE_OTHER): Payer: BC Managed Care – PPO | Admitting: Physician Assistant

## 2014-09-20 VITALS — BP 142/92 | HR 84 | Temp 98.4°F | Resp 18 | Ht 66.75 in | Wt 168.0 lb

## 2014-09-20 DIAGNOSIS — M542 Cervicalgia: Secondary | ICD-10-CM

## 2014-09-20 MED ORDER — MELOXICAM 7.5 MG PO TABS: 7.5000 mg | ORAL_TABLET | Freq: Every day | ORAL | Status: DC

## 2014-09-20 MED ORDER — METAXALONE 800 MG PO TABS: 800.0000 mg | ORAL_TABLET | Freq: Four times a day (QID) | ORAL | Status: DC

## 2014-09-20 NOTE — Progress Notes (Signed)
Patient ID: Natasha Stephens MRN: [id number], DOB: [birth year], 48 y.o. Date of Encounter: 09/20/2014, 10:47 AM    Chief Complaint:  Chief Complaint  Patient presents with  . sick x 1 week    rt side head, earache     HPI: 48 y.o. year old white female says that she has been feeling this discomfort over the past week. Has been feeling a burning type discomfort up into her right side of her head. Also down the right side of her neck. Says that yesterday she mentioned to her mom that she thought that even her ear was hurting so her mom says she better get that checked. Says that she has been having no mucus from her nose and no nasal congestion. Has had no fevers and no chills. Has had no rash on her skin.     Home Meds:   Outpatient Prescriptions Prior to Visit  Medication Sig Dispense Refill  . buPROPion (WELLBUTRIN XL) 150 MG 24 hr tablet TAKE 2 TABLETS DAILY 180 tablet 1  . metoprolol succinate (TOPROL-XL) 25 MG 24 hr tablet Take 1 tablet (25 mg total) by mouth daily. 90 tablet 3  . buPROPion (WELLBUTRIN SR) 150 MG 12 hr tablet Take 300 mg by mouth daily.    Marland Kitchen EPINEPHrine (EPIPEN) 0.3 mg/0.3 mL IJ SOAJ injection Inject 0.3 mLs (0.3 mg total) into the muscle once. 1 Device 1   No facility-administered medications prior to visit.    Allergies: No Known Allergies    Review of Systems: See HPI for pertinent ROS. All other ROS negative.    Physical Exam: Blood pressure 142/92, pulse 84, temperature 98.4 F (36.9 C), temperature source Oral, resp. rate 18, height 5' 6.75" (1.695 m), weight 168 lb (76.204 kg)., Body mass index is 26.52 kg/(m^2). General: WNWD WF.  Appears in no acute distress. HEENT: Normocephalic, atraumatic, eyes without discharge, sclera non-icteric, nares are without discharge. Bilateral auditory canals clear, TM's are without perforation, pearly grey and translucent with reflective cone of light bilaterally. Oral cavity moist, posterior pharynx without  exudate, erythema, peritonsillar abscess.  No tenderness with percussion of frontal or maxillary sinuses bilaterally. No tenderness with palpation of the TM joints bilaterally.   Neck: Supple. No thyromegaly. No lymphadenopathy. Severe tenderness with palpation of the right neck over the trapezius and other muscles of the neck.  I palpated both sides of her neck at the same time and she says that there is no tenderness on the left but severe tenderness on the right. There is no rash on the skin of the neck or the scalp. Lungs: Clear bilaterally to auscultation without wheezes, rales, or rhonchi. Breathing is unlabored. Heart: Regular rhythm. No murmurs, rubs, or gallops. Msk:  Strength and tone normal for age. Extremities/Skin: Warm and dry. There is no rash on the scalp on the right side of her head and there is no rash on the right side of her neck. No rash c/w shingles.  Neuro: Alert and oriented X 3. Moves all extremities spontaneously. Gait is normal. CNII-XII grossly in tact. Psych:  Responds to questions appropriately with a normal affect.     ASSESSMENT AND PLAN:  48 y.o. year old female with  1. Musculoskeletal neck pain  And I palpated her neck and she had severe tenderness with palpation of the muscles on the right side of the neck, this was causing pain to shoot and radiate into the right head. He then said that she had been under  significant stress. She stated that her father died unexpectedly 3 months ago. She also said that she and her family are in the process of moving and they are downsizing--says that their house sold the first day they put it on the market so they have had to rush and go very quickly. Because they are downsizing they are having to get rid of a lot of stuff. They close on the houses this upcoming Friday and move this upcoming weekend. Has had a lot of increased stress and also has been boxing things up and moving things. She does work as a  Network engineer. Discussed applying heat to the area including heating pad or warm water in the shower. Discussed stretching the neck routinely throughout the day and to also do range of motion movement of the shoulders throughout the day. She will test the Skelaxin when she gets home from work and see if it causes drowsiness. If it does cause drowsiness and she will have to take this only at bedtime but if it does not and she can take it 4 times a day. We'll take the Modic once daily with food.  She is to follow-up of her symptoms do not improve in the next few days or if she develops new worsening symptoms. - metaxalone (SKELAXIN) 800 MG tablet; Take 1 tablet (800 mg total) by mouth 4 (four) times daily.  Dispense: 60 tablet; Refill: 0 - meloxicam (MOBIC) 7.5 MG tablet; Take 1 tablet (7.5 mg total) by mouth daily.  Dispense: 30 tablet; Refill: 0   Signed, [address] [place], [place], [facility] 09/20/2014 10:47 AM

## 2014-12-29 ENCOUNTER — Other Ambulatory Visit: Payer: Self-pay | Admitting: Family Medicine

## 2015-01-28 ENCOUNTER — Other Ambulatory Visit: Payer: Self-pay | Admitting: Family Medicine

## 2015-01-28 NOTE — Telephone Encounter (Signed)
Refill appropriate and filled per protocol. 

## 2015-03-22 ENCOUNTER — Ambulatory Visit (INDEPENDENT_AMBULATORY_CARE_PROVIDER_SITE_OTHER): Payer: 59 | Admitting: Family Medicine

## 2015-03-22 ENCOUNTER — Encounter: Payer: Self-pay | Admitting: Family Medicine

## 2015-03-22 VITALS — BP 122/90 | HR 80 | Temp 98.6°F | Resp 16 | Ht 66.0 in | Wt 170.0 lb

## 2015-03-22 DIAGNOSIS — I10 Essential (primary) hypertension: Secondary | ICD-10-CM

## 2015-03-22 DIAGNOSIS — M542 Cervicalgia: Secondary | ICD-10-CM

## 2015-03-22 DIAGNOSIS — R55 Syncope and collapse: Secondary | ICD-10-CM

## 2015-03-22 NOTE — Progress Notes (Signed)
Subjective:    Patient ID: Natasha Stephens, female    DOB: 10/06/1966, 49 y.o.   MRN: [id number]  HPI  Patient reports that she is having frequent episodes of lightheadedness. Patient will Fu extremely flushed. She was lightheaded. Then she will file she is about to pass out. This even has her concern to drive. She also reports increasing frequency of palpitations. In the past I have performed a Holter monitor which revealed PVCs and occasional sinus tachycardia. I empirically started the patient on Toprol-XL 25 mg by mouth daily. This initially worked well however her symptoms are beginning to breakthrough even beyond this. She also states that her blood pressure has been more elevated recently and is typically running 130/90 at home. She also reports pain in her neck. She reports crepitus. Examination of the neck today is normal. She does have some tenderness to palpation in the muscles. Past Medical History  Diagnosis Date  . Endometriosis   . Fibrocystic breast changes   . Migraine   . Hypertension   . Depression   . Arthritis   . Fibromyalgia    Past Surgical History  Procedure Laterality Date  . Laparoscopic vaginal hysterectomy  2007  . Wrist surgery      right  . Myomectomy    . Laparotomy     Current Outpatient Prescriptions on File Prior to Visit  Medication Sig Dispense Refill  . metoprolol succinate (TOPROL-XL) 25 MG 24 hr tablet TAKE 1 TABLET DAILY 90 tablet 2   No current facility-administered medications on file prior to visit.   No Known Allergies History   Social History  . Marital Status: Married    Spouse Name: N/A  . Number of Children: 0  . Years of Education: N/A   Occupational History  . admin assistant    Social History Main Topics  . Smoking status: Never Smoker   . Smokeless tobacco: Never Used  . Alcohol Use: No  . Drug Use: No  . Sexual Activity: Not on file   Other Topics Concern  . Not on file   Social History Narrative      Review of Systems  All other systems reviewed and are negative.      Objective:   Physical Exam  Constitutional: She appears well-developed and well-nourished.  Cardiovascular: Normal rate, regular rhythm, normal heart sounds and intact distal pulses.  Exam reveals no gallop and no friction rub.   No murmur heard. Pulmonary/Chest: Effort normal and breath sounds normal. No respiratory distress. She has no wheezes. She has no rales.  Abdominal: Soft. Bowel sounds are normal. She exhibits no distension. There is no tenderness. There is no rebound and no guarding.  Musculoskeletal: She exhibits no edema.       Cervical back: She exhibits tenderness and pain. She exhibits normal range of motion, no bony tenderness and no spasm.  Vitals reviewed.         Assessment & Plan:  Benign essential HTN  Musculoskeletal neck pain - Plan: DG Cervical Spine Complete  Near syncope - Plan: Ambulatory referral to Cardiology  I believe the patient's symptoms are consistent with vasovagal presyncope. I also believe that she is having muscular pain in her neck but is likely due to stress and tension. The vasovagal presyncope could certainly be due to anxiety as well. I will also consult a cardiologist. The patient Brault benefit from an event monitor to rule out other types of cardiac arrhythmias and possibly a tilt table  test given the frequency of her symptoms. If cardiology evaluation is normal, I would empirically start the patient on an SSRI to potentially help with anxiety and stress which I feel is likely increasing her blood pressure contributing to her muscular pain in her neck, and possibly also contributing to some of her vasovagal presyncope/possible panic attacks.

## 2015-03-29 ENCOUNTER — Encounter: Payer: Self-pay | Admitting: *Deleted

## 2015-04-15 ENCOUNTER — Encounter: Payer: Self-pay | Admitting: Family Medicine

## 2015-05-18 ENCOUNTER — Ambulatory Visit (INDEPENDENT_AMBULATORY_CARE_PROVIDER_SITE_OTHER): Payer: 59 | Admitting: Cardiology

## 2015-05-18 ENCOUNTER — Encounter: Payer: Self-pay | Admitting: Cardiology

## 2015-05-18 VITALS — BP 144/98 | HR 83 | Ht 66.0 in | Wt 172.0 lb

## 2015-05-18 DIAGNOSIS — I493 Ventricular premature depolarization: Secondary | ICD-10-CM | POA: Diagnosis not present

## 2015-05-18 DIAGNOSIS — R000 Tachycardia, unspecified: Secondary | ICD-10-CM | POA: Diagnosis not present

## 2015-05-18 DIAGNOSIS — R002 Palpitations: Secondary | ICD-10-CM

## 2015-05-18 LAB — TSH: TSH: 0.758 u[IU]/mL (ref 0.350–4.500)

## 2015-05-18 NOTE — Progress Notes (Signed)
Cardiology Office Note   Date:  05/18/2015   ID:  Natasha Stephens, DOB July 10, 1966, MRN [id number]  PCP:  Odette Fraction, MD  Cardiologist:   Minus Breeding, MD   Chief Complaint  Patient presents with  . Presyncope      History of Present Illness: Natasha Stephens is a 49 y.o. female who presents for evaluation of palpitations and episodes of dizziness or pressure sensation inside her head. This is difficult to describe. Almost sounds like a tunnel vision episode. Her pressure inside her head will feel increased. It is not like her previous migraines. It happens sporadically. It is not brought on by any activities or certain times of the day or certain positions. She does feel her heart racing and she doesn't know which comes first. She does occasionally have some chest pressure and occasional sensation of getting short of breath when all this happens. She Hollis wake up with her heart pounding but not necessarily fast. I will be more intense beat. When her episodes happen she feels like she might almost pass out but she hasn't had any syncope. She's not overly active but with her activities of daily living she cannot bring on the symptoms. She does not have any exercise or activity induced shortness of breath and has no PND or orthopnea. She's had no prior cardiac issues other than some palpitations in 2012. She had an unrevealing monitor at that time and I looked at these results.  Past Medical History  Diagnosis Date  . Endometriosis   . Fibrocystic breast changes   . Migraine   . Hypertension   . Depression   . Arthritis   . Fibromyalgia     Past Surgical History  Procedure Laterality Date  . Laparoscopic vaginal hysterectomy  2007  . Wrist surgery      right  . Myomectomy    . Laparotomy       Current Outpatient Prescriptions  Medication Sig Dispense Refill  . metoprolol succinate (TOPROL-XL) 25 MG 24 hr tablet TAKE 1 TABLET DAILY 90 tablet 2   No current  facility-administered medications for this visit.    Allergies:   Review of patient's allergies indicates no known allergies.    Social History:  The patient  reports that she has never smoked. She has never used smokeless tobacco. She reports that she does not drink alcohol or use illicit drugs.   Family History:  The patient's family history includes Heart attack (age of onset: 3) in her father; Mitral valve prolapse in her brother; Rheum arthritis in her father; Skin cancer in her father and sister; Stroke in her father.    ROS:  Please see the history of present illness.   Otherwise, review of systems are positive for none.   All other systems are reviewed and negative.    PHYSICAL EXAM: VS:  BP 144/98 mmHg  Pulse 83  Ht 5\' 6"  (1.676 m)  Wt 172 lb (78.019 kg)  BMI 27.77 kg/m2 , BMI Body mass index is 27.77 kg/(m^2). GENERAL:  Well appearing HEENT:  Pupils equal round and reactive, fundi not visualized, oral mucosa unremarkable NECK:  No jugular venous distention, waveform within normal limits, carotid upstroke brisk and symmetric, no bruits, no thyromegaly LYMPHATICS:  No cervical, inguinal adenopathy LUNGS:  Clear to auscultation bilaterally BACK:  No CVA tenderness CHEST:  Unremarkable HEART:  PMI not displaced or sustained,S1 and S2 within normal limits, no S3, no S4, no clicks, no rubs, no  murmurs ABD:  Flat, positive bowel sounds normal in frequency in pitch, no bruits, no rebound, no guarding, no midline pulsatile mass, no hepatomegaly, no splenomegaly EXT:  2 plus pulses throughout, no edema, no cyanosis no clubbing SKIN:  No rashes no nodules NEURO:  Cranial nerves II through XII grossly intact, motor grossly intact throughout PSYCH:  Cognitively intact, oriented to person place and time    EKG:  EKG is ordered today. The ekg ordered today demonstrates sinus rhythm, rate 83, axis within normal limits, intervals within normal limits, no acute ST-T wave  changes.   Recent Labs: No results found for requested labs within last 365 days.    Lipid Panel    Component Value Date/Time   CHOL 191 01/27/2014 0841   TRIG 55 01/27/2014 0841   HDL 55 01/27/2014 0841   CHOLHDL 3.5 01/27/2014 0841   VLDL 11 01/27/2014 0841   LDLCALC 125* 01/27/2014 0841      Wt Readings from Last 3 Encounters:  05/18/15 172 lb (78.019 kg)  03/22/15 170 lb (77.111 kg)  09/20/14 168 lb (76.204 kg)      Other studies Reviewed: Additional studies/ records that were reviewed today include: Previous monitor results. Review of the above records demonstrates:  Please see elsewhere in the note.     ASSESSMENT AND PLAN:  PALPITATIONS:  The patient will need a monitor. Natasha Stephens will need a 21 day event monitor.  The patients symptoms necessitate an event monitor.  The symptoms are too infrequent to be identified on a Holter monitor.    She will have a TSH  CHEST PAIN:  This is very atypical. The pretest probability of obstructive coronary disease is very low. No further cardiac testing testing is suggested.  HTN:  Her blood pressure is elevated today. She is going to keep a blood pressure diary and let me know if this continues to be consistently above 140/90 range.  Current medicines are reviewed at length with the patient today.  The patient does not have concerns regarding medicines.  The following changes have been made:  no change  Labs/ tests ordered today include:   Orders Placed This Encounter  Procedures  . TSH  . Cardiac event monitor  . EKG 12-Lead     Disposition:   FU with me in 2 months    Signed, Minus Breeding, MD  05/18/2015 10:03 AM    [place]

## 2015-05-18 NOTE — Patient Instructions (Signed)
Your physician recommends that you schedule a follow-up appointment in: 2 Months  Your physician has recommended that you wear an event monitor. Event monitors are medical devices that record the heart's electrical activity. Doctors most often [place] these monitors to diagnose arrhythmias. Arrhythmias are problems with the speed or rhythm of the heartbeat. The monitor is a small, portable device. You can wear one while you do your normal daily activities. This is usually used to diagnose what is causing palpitations/syncope (passing out).  Your physician recommends that you return for lab work TSH

## 2015-06-26 ENCOUNTER — Other Ambulatory Visit: Payer: Self-pay | Admitting: Family Medicine

## 2015-07-26 ENCOUNTER — Other Ambulatory Visit: Payer: Self-pay | Admitting: Obstetrics and Gynecology

## 2015-07-27 LAB — CYTOLOGY - PAP

## 2015-08-04 ENCOUNTER — Ambulatory Visit (INDEPENDENT_AMBULATORY_CARE_PROVIDER_SITE_OTHER): Payer: 59 | Admitting: Cardiology

## 2015-08-04 ENCOUNTER — Encounter: Payer: Self-pay | Admitting: Cardiology

## 2015-08-04 VITALS — BP 136/80 | HR 50 | Ht 66.0 in | Wt 177.7 lb

## 2015-08-04 DIAGNOSIS — I493 Ventricular premature depolarization: Secondary | ICD-10-CM | POA: Diagnosis not present

## 2015-08-04 NOTE — Progress Notes (Signed)
Cardiology Office Note   Date:  08/04/2015   ID:  Natasha Stephens, DOB 04-17-1966, MRN [id number]  PCP:  Odette Fraction, MD  Cardiologist:   Minus Breeding, MD   Chief Complaint  Patient presents with  . Palpitations      History of Present Illness: Natasha Stephens is a 49 y.o. female who presents for evaluation of palpitations and episodes of dizziness or pressure sensation inside her head.  This  Is described in the previous note. She did wear an event monitor since I saw her. She's had some episodes at night but she didn't capture these by pressing the button. I did review the event monitor and there were no arrhythmias noted. She now can tell me that when she has a rapid heart rate starts with a sensation in her head. Previously she was not sure which came first her head discomfort or her palpitations. She has been keeping her blood pressure at home and it has been well controlled. She's had no chest pain. She has started walking.  Past Medical History  Diagnosis Date  . Endometriosis   . Fibrocystic breast changes   . Migraine   . Hypertension   . Depression   . Arthritis   . Fibromyalgia     Past Surgical History  Procedure Laterality Date  . Laparoscopic vaginal hysterectomy  2007  . Wrist surgery      right  . Myomectomy    . Laparotomy       Current Outpatient Prescriptions  Medication Sig Dispense Refill  . metoprolol succinate (TOPROL-XL) 25 MG 24 hr tablet TAKE 1 TABLET DAILY 90 tablet 2   No current facility-administered medications for this visit.    Allergies:   Review of patient's allergies indicates no known allergies.    ROS:  Please see the history of present illness.   Otherwise, review of systems are positive for none.   All other systems are reviewed and negative.    PHYSICAL EXAM: VS:  BP 136/80 mmHg  Pulse 50  Ht 5\' 6"  (1.676 m)  Wt 177 lb 11.2 oz (80.604 kg)  BMI 28.70 kg/m2 , BMI Body mass index is 28.7 kg/(m^2). GENERAL:   Well appearing NECK:  No jugular venous distention, waveform within normal limits, carotid upstroke brisk and symmetric, no bruits, no thyromegaly LUNGS:  Clear to auscultation bilaterally CHEST:  Unremarkable HEART:  PMI not displaced or sustained,S1 and S2 within normal limits, no S3, no S4, no clicks, no rubs, no murmurs ABD:  Flat, positive bowel sounds normal in frequency in pitch, no bruits, no rebound, no guarding, no midline pulsatile mass, no hepatomegaly, no splenomegaly EXT:  2 plus pulses throughout, no edema, no cyanosis no clubbing   EKG:  EKG is not ordered today.    Recent Labs: 05/18/2015: TSH 0.758    Lipid Panel    Component Value Date/Time   CHOL 191 01/27/2014 0841   TRIG 55 01/27/2014 0841   HDL 55 01/27/2014 0841   CHOLHDL 3.5 01/27/2014 0841   VLDL 11 01/27/2014 0841   LDLCALC 125* 01/27/2014 0841      Wt Readings from Last 3 Encounters:  08/04/15 177 lb 11.2 oz (80.604 kg)  05/18/15 172 lb (78.019 kg)  03/22/15 170 lb (77.111 kg)      Other studies Reviewed: Additional studies/ records that were reviewed today include: Previous monitor results. Review of the above records demonstrates:  Please see elsewhere in the note.  ASSESSMENT AND PLAN:  PALPITATIONS:   It Basquez be that these palpitations started after she has a vague sensation in her head that could be an atypical migraine. Since she has a history of migraines I will send her for neurology evaluation rather than further ardiac workup.  CHEST PAIN:   She has had no further chest pain. The pretest probability of obstructive coronary disease is very low. No further cardiac testing testing is suggested.  HTN:  Her blood pressure is well controlled and she kept a diary.   She wants to try to wean herself off of beta blockers which I think is reasonable.  Current medicines are reviewed at length with the patient today.  The patient does not have concerns regarding medicines.  The following  changes have been made:  no change  Labs/ tests ordered today include:   No orders of the defined types were placed in this encounter.     Disposition:   FU with me as needed   Signed, Minus Breeding, MD  08/04/2015 4:59 PM    [facility]

## 2015-08-04 NOTE — Patient Instructions (Signed)
Your physician recommends that you schedule a follow-up appointment As Needed  

## 2015-09-05 ENCOUNTER — Encounter: Payer: Self-pay | Admitting: Family Medicine

## 2015-09-05 ENCOUNTER — Ambulatory Visit (INDEPENDENT_AMBULATORY_CARE_PROVIDER_SITE_OTHER): Payer: 59 | Admitting: Family Medicine

## 2015-09-05 VITALS — BP 150/100 | HR 84 | Temp 98.5°F | Resp 16 | Ht 66.0 in | Wt 175.0 lb

## 2015-09-05 DIAGNOSIS — S39012A Strain of muscle, fascia and tendon of lower back, initial encounter: Secondary | ICD-10-CM | POA: Diagnosis not present

## 2015-09-05 MED ORDER — CYCLOBENZAPRINE HCL 10 MG PO TABS: 10.0000 mg | ORAL_TABLET | Freq: Three times a day (TID) | ORAL | Status: DC | PRN

## 2015-09-05 MED ORDER — KETOROLAC TROMETHAMINE 10 MG PO TABS: 10.0000 mg | ORAL_TABLET | Freq: Four times a day (QID) | ORAL | Status: DC | PRN

## 2015-09-05 NOTE — Progress Notes (Signed)
   Subjective:    Patient ID: Natasha Stephens, female    DOB: 07/31/1966, 49 y.o.   MRN: [id number]  HPI One week ago, the patient injured her lower back. She stated that she slept on her back wrong and has been tight and painful ever since.  She is having a muscle spasm around the level of L4. She has decreased range of motion. She denies any falls or injuries were a bony injury could've been sustained. She denies any numbness or tingling radiating down her leg or weakness in her legs. Past Medical History  Diagnosis Date  . Endometriosis   . Fibrocystic breast changes   . Migraine   . Hypertension   . Depression   . Arthritis   . Fibromyalgia    Past Surgical History  Procedure Laterality Date  . Laparoscopic vaginal hysterectomy  2007  . Wrist surgery      right  . Myomectomy    . Laparotomy     Current Outpatient Prescriptions on File Prior to Visit  Medication Sig Dispense Refill  . metoprolol succinate (TOPROL-XL) 25 MG 24 hr tablet TAKE 1 TABLET DAILY (Patient not taking: Reported on 09/05/2015) 90 tablet 2   No current facility-administered medications on file prior to visit.   No Known Allergies Social History   Social History  . Marital Status: Married    Spouse Name: N/A  . Number of Children: 0  . Years of Education: N/A   Occupational History  . Admin assistant    Social History Main Topics  . Smoking status: Never Smoker   . Smokeless tobacco: Never Used  . Alcohol Use: No  . Drug Use: No  . Sexual Activity: Not on file   Other Topics Concern  . Not on file   Social History Narrative   Lives with husband.        Review of Systems  All other systems reviewed and are negative.      Objective:   Physical Exam  Constitutional: She appears well-developed and well-nourished.  Neck: Neck supple.  Cardiovascular: Normal rate, regular rhythm and normal heart sounds.   Pulmonary/Chest: Effort normal and breath sounds normal. No respiratory  distress. She has no wheezes. She has no rales.  Abdominal: Soft. Bowel sounds are normal. She exhibits no distension. There is no tenderness. There is no rebound.  Vitals reviewed.         Assessment & Plan:  Low back strain, initial encounter - Plan: ketorolac (TORADOL) 10 MG tablet, cyclobenzaprine (FLEXERIL) 10 MG tablet  I believe the patient pulled a muscle in her lower back. Begin Toradol 10 mg every 6 hours for 4 days. Use Flexeril 10 mg every 8 hours as needed for muscle spasms.  Recheck in one week or sooner if worse

## 2015-10-25 ENCOUNTER — Other Ambulatory Visit: Payer: Self-pay | Admitting: Physician Assistant

## 2015-10-25 NOTE — Telephone Encounter (Signed)
Medication refilled per protocol. 

## 2016-01-24 ENCOUNTER — Ambulatory Visit (INDEPENDENT_AMBULATORY_CARE_PROVIDER_SITE_OTHER): Payer: 59 | Admitting: Family Medicine

## 2016-01-24 ENCOUNTER — Encounter: Payer: Self-pay | Admitting: Family Medicine

## 2016-01-24 VITALS — BP 152/98 | HR 80 | Temp 98.4°F | Resp 14 | Ht 66.0 in | Wt 177.0 lb

## 2016-01-24 DIAGNOSIS — I10 Essential (primary) hypertension: Secondary | ICD-10-CM | POA: Diagnosis not present

## 2016-01-24 DIAGNOSIS — K921 Melena: Secondary | ICD-10-CM | POA: Diagnosis not present

## 2016-01-24 MED ORDER — LOSARTAN POTASSIUM 50 MG PO TABS: 50.0000 mg | ORAL_TABLET | Freq: Every day | ORAL | Status: DC

## 2016-01-24 NOTE — Progress Notes (Signed)
   Subjective:    Patient ID: Natasha Stephens, female    DOB: 06-Apr-1966, 50 y.o.   MRN: [id number]  HPI Patient states that over the last several weeks she has had frequent episodes of blood in her stool. It is bright red and occasionally dark red and maroon in color. Friday, the patient states that the blood was so copious that filled the bowl. In every instance it is painless. She denies any pain with defecation. She does state that she has an internal hemorrhoid that occasionally bothers her. She had a normal colonoscopy in 2013. She denies any weight loss or fevers. However she has been getting crampy left lower quadrant abdominal pain recently. She also has frequent bouts of diarrhea for no reason. Past Medical History  Diagnosis Date  . Endometriosis   . Fibrocystic breast changes   . Migraine   . Hypertension   . Depression   . Arthritis   . Fibromyalgia    Past Surgical History  Procedure Laterality Date  . Laparoscopic vaginal hysterectomy  2007  . Wrist surgery      right  . Myomectomy    . Laparotomy     Current Outpatient Prescriptions on File Prior to Visit  Medication Sig Dispense Refill  . metoprolol succinate (TOPROL-XL) 25 MG 24 hr tablet TAKE 1 TABLET DAILY 90 tablet 1   No current facility-administered medications on file prior to visit.   No Known Allergies Social History   Social History  . Marital Status: Married    Spouse Name: N/A  . Number of Children: 0  . Years of Education: N/A   Occupational History  . Admin assistant    Social History Main Topics  . Smoking status: Never Smoker   . Smokeless tobacco: Never Used  . Alcohol Use: No  . Drug Use: No  . Sexual Activity: Not on file   Other Topics Concern  . Not on file   Social History Narrative   Lives with husband.        Review of Systems  All other systems reviewed and are negative.      Objective:   Physical Exam  Cardiovascular: Normal rate, regular rhythm and normal heart  sounds.   Pulmonary/Chest: Effort normal and breath sounds normal. No respiratory distress. She has no wheezes. She has no rales.  Abdominal: Soft. Bowel sounds are normal. She exhibits no distension. There is no tenderness. There is no rebound and no guarding.  Vitals reviewed.         Assessment & Plan:  Benign essential HTN - Plan: losartan (COZAAR) 50 MG tablet  Hematochezia - Plan: Ambulatory referral to Gastroenterology  Patient's blood pressure today is elevated. I will add losartan 50 mg by mouth daily to her metoprolol and recheck blood pressure in one month. Given her crampy lower quadrant abdominal pain, her episodes of hematochezia, and her frequent diarrhea, I believe she will benefit from a repeat colonoscopy to rule out inflammatory bowel disease or other intestinal problems. I suspect internal hemorrhoids and IBS but I cannot be sure without further investigation

## 2016-01-25 ENCOUNTER — Encounter: Payer: Self-pay | Admitting: Gastroenterology

## 2016-02-28 ENCOUNTER — Ambulatory Visit: Payer: 59 | Admitting: Gastroenterology

## 2016-04-03 ENCOUNTER — Ambulatory Visit: Payer: 59 | Admitting: Gastroenterology

## 2016-04-04 ENCOUNTER — Other Ambulatory Visit: Payer: Self-pay

## 2016-04-09 ENCOUNTER — Encounter: Payer: Self-pay | Admitting: Gastroenterology

## 2016-04-23 ENCOUNTER — Other Ambulatory Visit: Payer: Self-pay | Admitting: Family Medicine

## 2016-04-24 NOTE — Telephone Encounter (Signed)
Refill appropriate and filled per protocol. 

## 2016-07-11 ENCOUNTER — Encounter: Payer: Self-pay | Admitting: Emergency Medicine

## 2016-07-20 ENCOUNTER — Encounter: Payer: Self-pay | Admitting: Family Medicine

## 2016-07-20 ENCOUNTER — Ambulatory Visit (INDEPENDENT_AMBULATORY_CARE_PROVIDER_SITE_OTHER): Payer: 59 | Admitting: Family Medicine

## 2016-07-20 VITALS — BP 122/84 | HR 76 | Temp 98.5°F | Resp 16 | Ht 66.0 in | Wt 172.0 lb

## 2016-07-20 DIAGNOSIS — I10 Essential (primary) hypertension: Secondary | ICD-10-CM | POA: Diagnosis not present

## 2016-07-20 DIAGNOSIS — K921 Melena: Secondary | ICD-10-CM | POA: Diagnosis not present

## 2016-07-20 DIAGNOSIS — F418 Other specified anxiety disorders: Secondary | ICD-10-CM | POA: Diagnosis not present

## 2016-07-20 LAB — BASIC METABOLIC PANEL
BUN: 15 mg/dL (ref 7–25)
CHLORIDE: 105 mmol/L (ref 98–110)
CO2: 30 mmol/L (ref 20–31)
Calcium: 9.6 mg/dL (ref 8.6–10.2)
Creat: 0.96 mg/dL (ref 0.50–1.10)
Glucose, Bld: 91 mg/dL (ref 65–99)
Potassium: 3.8 mmol/L (ref 3.5–5.3)
Sodium: 143 mmol/L (ref 135–146)

## 2016-07-20 LAB — CBC WITH DIFFERENTIAL/PLATELET
Basophils Absolute: 54 cells/uL (ref 0–200)
Basophils Relative: 1 %
EOS ABS: 54 {cells}/uL (ref 15–500)
Eosinophils Relative: 1 %
HEMATOCRIT: 37.9 % (ref 35.0–45.0)
Hemoglobin: 13.1 g/dL (ref 11.7–15.5)
Lymphocytes Relative: 36 %
Lymphs Abs: 1944 cells/uL (ref 850–3900)
MCH: 30.6 pg (ref 27.0–33.0)
MCHC: 34.6 g/dL (ref 32.0–36.0)
MCV: 88.6 fL (ref 80.0–100.0)
MPV: 9.9 fL (ref 7.5–12.5)
Monocytes Absolute: 432 cells/uL (ref 200–950)
Monocytes Relative: 8 %
NEUTROS ABS: 2916 {cells}/uL (ref 1500–7800)
Neutrophils Relative %: 54 %
PLATELETS: 281 10*3/uL (ref 140–400)
RBC: 4.28 MIL/uL (ref 3.80–5.10)
RDW: 12.8 % (ref 11.0–15.0)
WBC: 5.4 10*3/uL (ref 3.8–10.8)

## 2016-07-20 LAB — LIPID PANEL
Cholesterol: 184 mg/dL (ref 125–200)
HDL: 59 mg/dL (ref >=46)
LDL CALC: 104 mg/dL (ref <130)
Total CHOL/HDL Ratio: 3.1 Ratio (ref <=5.0)
Triglycerides: 107 mg/dL (ref <150)
VLDL: 21 mg/dL (ref <30)

## 2016-07-20 LAB — HEPATIC FUNCTION PANEL
ALBUMIN: 4.6 g/dL (ref 3.6–5.1)
ALK PHOS: 69 U/L (ref 33–115)
ALT: 18 U/L (ref 6–29)
AST: 23 U/L (ref 10–35)
Bilirubin, Direct: 0.1 mg/dL (ref <=0.2)
Indirect Bilirubin: 0.3 mg/dL (ref 0.2–1.2)
TOTAL PROTEIN: 6.7 g/dL (ref 6.1–8.1)
Total Bilirubin: 0.4 mg/dL (ref 0.2–1.2)

## 2016-07-20 LAB — TSH: TSH: 0.59 mIU/L

## 2016-07-20 MED ORDER — CITALOPRAM HYDROBROMIDE 20 MG PO TABS: 20.0000 mg | ORAL_TABLET | Freq: Every day | ORAL | 3 refills | Status: AC
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
## 2016-07-20 NOTE — Patient Instructions (Signed)
Follow up in 1 month to recheck mood We'll notify you of your lab results and make any changes if needed Start the Celexa once daily for the anxiety/depression Please call [facility] and schedule an appt w/ Almyra Free [phone] (she's awesome!!!) Please make sure you are safe!  You are always welcome here! Call with any questions or concerns Hang in there!!!

## 2016-07-20 NOTE — Progress Notes (Signed)
Pre visit review using our clinic review tool, if applicable. No additional management support is needed unless otherwise documented below in the visit note. 

## 2016-07-23 ENCOUNTER — Ambulatory Visit (INDEPENDENT_AMBULATORY_CARE_PROVIDER_SITE_OTHER): Payer: 59 | Admitting: Internal Medicine

## 2016-07-23 ENCOUNTER — Other Ambulatory Visit (INDEPENDENT_AMBULATORY_CARE_PROVIDER_SITE_OTHER): Payer: 59

## 2016-07-23 ENCOUNTER — Encounter: Payer: Self-pay | Admitting: Internal Medicine

## 2016-07-23 ENCOUNTER — Other Ambulatory Visit: Payer: Self-pay | Admitting: Family Medicine

## 2016-07-23 VITALS — BP 110/76 | HR 92 | Ht 66.0 in | Wt 168.4 lb

## 2016-07-23 DIAGNOSIS — K921 Melena: Secondary | ICD-10-CM

## 2016-07-23 DIAGNOSIS — R1030 Lower abdominal pain, unspecified: Secondary | ICD-10-CM

## 2016-07-23 LAB — HIGH SENSITIVITY CRP: CRP, High Sensitivity: 0.51 mg/L (ref 0.000–5.000)

## 2016-07-23 MED ORDER — NA SULFATE-K SULFATE-MG SULF 17.5-3.13-1.6 GM/177ML PO SOLN: ORAL | 0 refills | Status: DC

## 2016-07-23 NOTE — Patient Instructions (Signed)
You have been scheduled for a colonoscopy. Please follow written instructions given to you at your visit today.  Please pick up your prep supplies at the pharmacy within the next 1-3 days. If you use inhalers (even only as needed), please bring them with you on the day of your procedure. Your physician has requested that you go to www.startemmi.com and enter the access code given to you at your visit today. This web site gives a general overview about your procedure. However, you should still follow specific instructions given to you by our office regarding your preparation for the procedure.  Your physician has requested that you go to the basement for the following lab work before leaving today: Fecal calprotectin, C Diff, O&P, CRP  If you are age 101 or older, your body mass index should be between 23-30. Your Body mass index is 27.18 kg/m. If this is out of the aforementioned range listed, please consider follow up with your Primary Care Provider.  If you are age 57 or younger, your body mass index should be between 19-25. Your Body mass index is 27.18 kg/m. If this is out of the aformentioned range listed, please consider follow up with your Primary Care Provider.

## 2016-07-23 NOTE — Progress Notes (Signed)
Patient ID: Natasha Stephens, female   DOB: 11-29-1965, 50 y.o.   MRN: [id number] HPI: Natasha Stephens is a 50 year old female with a past medical history of GERD, constipation, hyperplastic colon polyp previously hollow by Dr. Sharlett Iles but not seen in several years who is seen in consultation at the request of Dr. Birdie Riddle to evaluate lower abdominal pain with change in bowel habits and rectal bleeding. She is here alone today. She had an upper endoscopy and colonoscopy which were performed in April 2013 by Dr. Sharlett Iles. EGD for refractory GERD was normal including small bowel biopsies. Colonoscopy showed several small sigmoid colon polyps along with rectosigmoid polyps which were removed and found to be hyperplastic polyps. Exam was otherwise normal. Retroflexion did not show abnormality on that day.  She reports that she has developed on and off issues with lower abdominal cramping. Again this is intermittent. At times it feels like it radiates or also hurts in her right hip. This is associated with loose stools along with rectal bleeding. She seen both red blood on the stool but also frank blood and dark red stools. This is seemingly worse over the last several months. Prior to this change she had lifelong constipation. She also reports bowel movements occurring daily but feeling incomplete with tenesmus. She is also seen mucus in her stools. She's used rosemary as an essential oil which she rubs on her lower abdomen which she feels has somewhat helped. She denies weight loss. Denies upper GI symptoms. Previously heartburn was an issue but this has not been a problem of late. She is not on antacid therapy. She denies dysphagia and odynophagia.  There is no family history of IBD or GI tract malignancy. Her father and brother have had issues with heartburn and GERD. She is not use tobacco. No alcohol use. She works as an Scientist, water quality at [employer].    Past Medical History:  Diagnosis Date  . Arthritis   . Cervical  spondylosis   . Depression   . Endometriosis   . Fibrocystic breast changes   . Fibromyalgia    fibromyalgia  . Hyperplastic colon polyp   . Hypertension   . Migraine   . Raynaud's phenomenon     Past Surgical History:  Procedure Laterality Date  . LAPAROSCOPIC VAGINAL HYSTERECTOMY  2007  . LAPAROTOMY    . MYOMECTOMY    . WRIST SURGERY     right    Outpatient Medications Prior to Visit  Medication Sig Dispense Refill  . citalopram (CELEXA) 20 MG tablet Take 1 tablet (20 mg total) by mouth daily. 30 tablet 3  . losartan (COZAAR) 50 MG tablet Take 1 tablet (50 mg total) by mouth daily. 90 tablet 3  . metoprolol succinate (TOPROL-XL) 25 MG 24 hr tablet TAKE 1 TABLET DAILY 90 tablet 0   No facility-administered medications prior to visit.     Allergies  Allergen Reactions  . Doxycycline Swelling    tongue    Family History  Problem Relation Age of Onset  . Heart attack Father 48  . Skin cancer Father     basal and squamous  . Rheum arthritis Father   . Stroke Father   . Glaucoma Father   . Mitral valve prolapse Brother   . Skin cancer Sister   . Other Sister     carcinosarcoma  . Glaucoma Mother   . Hypertension Mother     Social History  Substance Use Topics  . Smoking status: Never Smoker  .  Smokeless tobacco: Never Used  . Alcohol use No    ROS: As per history of present illness, otherwise negative  BP 110/76 (BP Location: Left Arm, Patient Position: Sitting, Cuff Size: Normal)   Pulse 92   Ht 5\' 6"  (1.676 m)   Wt 168 lb 6.4 oz (76.4 kg)   BMI 27.18 kg/m  Constitutional: Well-developed and well-nourished. No distress. HEENT: Normocephalic and atraumatic. Oropharynx is clear and moist. No oropharyngeal exudate. Conjunctivae are normal.  No scleral icterus. Neck: Neck supple. Trachea midline. Cardiovascular: Normal rate, regular rhythm and intact distal pulses. No M/R/G Pulmonary/chest: Effort normal and breath sounds normal. No wheezing, rales or  rhonchi. Abdominal: Soft, lower abdominal tenderness without rebound or guarding, nondistended. Bowel sounds active throughout. There are no masses palpable. No hepatosplenomegaly. Extremities: no clubbing, cyanosis, or edema Lymphadenopathy: No cervical adenopathy noted. Neurological: Alert and oriented to person place and time. Skin: Skin is warm and dry. No rashes noted. Psychiatric: Normal mood and affect. Behavior is normal.  RELEVANT LABS AND IMAGING: CBC    Component Value Date/Time   WBC 5.4 07/20/2016 1442   RBC 4.28 07/20/2016 1442   HGB 13.1 07/20/2016 1442   HCT 37.9 07/20/2016 1442   PLT 281 07/20/2016 1442   MCV 88.6 07/20/2016 1442   MCH 30.6 07/20/2016 1442   MCHC 34.6 07/20/2016 1442   RDW 12.8 07/20/2016 1442   LYMPHSABS 1,944 07/20/2016 1442   MONOABS 432 07/20/2016 1442   EOSABS 54 07/20/2016 1442   BASOSABS 54 07/20/2016 1442    CMP     Component Value Date/Time   NA 143 07/20/2016 1442   K 3.8 07/20/2016 1442   CL 105 07/20/2016 1442   CO2 30 07/20/2016 1442   GLUCOSE 91 07/20/2016 1442   BUN 15 07/20/2016 1442   CREATININE 0.96 07/20/2016 1442   CALCIUM 9.6 07/20/2016 1442   PROT 6.7 07/20/2016 1442   ALBUMIN 4.6 07/20/2016 1442   AST 23 07/20/2016 1442   ALT 18 07/20/2016 1442   ALKPHOS 69 07/20/2016 1442   BILITOT 0.4 07/20/2016 1442   GFRNONAA 67 (L) 02/11/2012 1554   GFRAA 77 (L) 02/11/2012 1554    ASSESSMENT/PLAN: 50 year old female with a past medical history of GERD, constipation, hyperplastic colon polyp previously hollow by Dr. Sharlett Iles but not seen in several years who is seen in consultation at the request of Dr. Birdie Riddle to evaluate lower abdominal pain with change in bowel habits and rectal bleeding.  1. Lower abdominal pain/bloody stools/change in bowel habit -- her symptoms are suspicion is for inflammatory bowel disease. I recommended C. difficile and ova and parasite testing on stool. Fecal calprotectin and high-sensitivity  CRP. I've also recommended colonoscopy for direct visualization and to exclude IBD. Other possibilities include IBS with hemorrhoidal bleeding. We discussed the risks, benefits and alternatives to colonoscopy and she wishes to proceed.    SB:[id number], [address], [place] [address]

## 2016-07-23 NOTE — Assessment & Plan Note (Signed)
New to provider, ongoing for pt.  Adequate control today.  Asymptomatic w/ exception of fatigue which is most likely due to her current depressive situation.  Check labs.  No anticipated med changes.  Will follow.
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
## 2016-07-27 ENCOUNTER — Encounter: Payer: Self-pay | Admitting: Internal Medicine

## 2016-07-31 ENCOUNTER — Telehealth: Payer: Self-pay | Admitting: Family Medicine

## 2016-07-31 ENCOUNTER — Encounter: Payer: Self-pay | Admitting: Family Medicine

## 2016-07-31 DIAGNOSIS — I10 Essential (primary) hypertension: Secondary | ICD-10-CM

## 2016-07-31 MED ORDER — METOPROLOL SUCCINATE ER 25 MG PO TB24: 25.0000 mg | ORAL_TABLET | Freq: Every day | ORAL | 1 refills | Status: DC

## 2016-07-31 MED ORDER — LOSARTAN POTASSIUM 50 MG PO TABS: 50.0000 mg | ORAL_TABLET | Freq: Every day | ORAL | 1 refills | Status: DC

## 2016-07-31 NOTE — Telephone Encounter (Signed)
Called pt and informed that Rx's were filled for 6 months, also advised pt that if she needed anything to please let us know. DPR changed to have spouse removed .

## 2016-07-31 NOTE — Telephone Encounter (Signed)
Pt states that her and her husband have separated and that she will be going to [place]. Pt is unable to go to her home for clothes, meds, etc. and asking if KT would call in a refill on losartan and metoprolol to [facility] at [address] [place], [place] [address] Ph # [phone]

## 2016-08-08 ENCOUNTER — Encounter: Payer: 59 | Admitting: Internal Medicine

## 2016-08-20 ENCOUNTER — Ambulatory Visit (INDEPENDENT_AMBULATORY_CARE_PROVIDER_SITE_OTHER): Payer: 59 | Admitting: Family Medicine

## 2016-08-20 ENCOUNTER — Encounter: Payer: Self-pay | Admitting: Family Medicine

## 2016-08-20 VITALS — BP 110/72 | HR 76 | Temp 98.1°F | Resp 16 | Ht 66.0 in | Wt 162.5 lb

## 2016-08-20 DIAGNOSIS — F418 Other specified anxiety disorders: Secondary | ICD-10-CM | POA: Diagnosis not present

## 2016-08-20 NOTE — Assessment & Plan Note (Signed)
Improved since leaving her husband last month.  She is safe and staying w/ family in [place] and commuting back and forth to work.  She wants to keep her job for the next several months and then decide what to do.  She now has her own back account and funds.  She feels physically safe at this time.  No med changes at this time.  Applauded her bravery and courage.  Will continue to follow.

## 2016-08-20 NOTE — Progress Notes (Signed)
   Subjective:    Patient ID: Natasha Stephens, female    DOB: November 19, 1965, 50 y.o.   MRN: [id number]  HPI Anxiety/depression- pt was started on Celexa at last visit.  Since then, she has left her husband and moved to [place] w/ family.  Pt reports feeling better even with all the big changes.  Was able to set up a new bank account.  Husband started separation paperwork and legal division of assets within 3 days of pt leaving.  Pt continues to work here locally and commute.  Does not feel physically threatened at this time   Review of Systems For ROS see HPI     Objective:   Physical Exam  Constitutional: She is oriented to person, place, and time. She appears well-developed and well-nourished. No distress.  HENT:  Head: Normocephalic and atraumatic.  Neurological: She is alert and oriented to person, place, and time.  Skin: Skin is warm and dry.  Psychiatric: She has a normal mood and affect. Her behavior is normal. Judgment and thought content normal.  Vitals reviewed.         Assessment & Plan:

## 2016-08-20 NOTE — Patient Instructions (Signed)
Schedule your complete physical in 3-4 months Continue the Celexa 20mg  daily- you're doing great! I would look into starting counseling to deal with everything- past, present, and future Call with any questions or concerns I'M SO PROUD OF YOU!!!

## 2016-08-20 NOTE — Progress Notes (Signed)
Pre visit review using our clinic review tool, if applicable. No additional management support is needed unless otherwise documented below in the visit note. 

## 2016-10-23 ENCOUNTER — Ambulatory Visit (INDEPENDENT_AMBULATORY_CARE_PROVIDER_SITE_OTHER): Payer: 59 | Admitting: Physician Assistant

## 2016-10-23 ENCOUNTER — Encounter: Payer: Self-pay | Admitting: Physician Assistant

## 2016-10-23 VITALS — BP 124/84 | HR 89 | Temp 98.7°F | Resp 16 | Ht 66.0 in | Wt 163.0 lb

## 2016-10-23 DIAGNOSIS — L03031 Cellulitis of right toe: Secondary | ICD-10-CM

## 2016-10-23 MED ORDER — CEPHALEXIN 500 MG PO CAPS: 500.0000 mg | ORAL_CAPSULE | Freq: Two times a day (BID) | ORAL | 0 refills | Status: AC

## 2016-10-23 NOTE — Progress Notes (Signed)
   Patient presents to clinic today c/o pain in medial R great toe since Friday now with redness and tenderness. Denies trauma or injury. Denies swelling, decreased ROM. Denies nail changes. Denies fever, chills.    Past Medical History:  Diagnosis Date  . Arthritis   . Cervical spondylosis   . Depression   . Endometriosis   . Fibrocystic breast changes   . Fibromyalgia    fibromyalgia  . Hyperplastic colon polyp   . Hypertension   . Migraine   . Raynaud's phenomenon     Current Outpatient Prescriptions on File Prior to Visit  Medication Sig Dispense Refill  . losartan (COZAAR) 50 MG tablet Take 1 tablet (50 mg total) by mouth daily. 90 tablet 1  . metoprolol succinate (TOPROL-XL) 25 MG 24 hr tablet Take 1 tablet (25 mg total) by mouth daily. 90 tablet 1  . citalopram (CELEXA) 20 MG tablet Take 1 tablet (20 mg total) by mouth daily. (Patient not taking: Reported on 10/23/2016) 30 tablet 3   No current facility-administered medications on file prior to visit.     Allergies  Allergen Reactions  . Doxycycline Swelling    tongue    Family History  Problem Relation Age of Onset  . Heart attack Father 55  . Skin cancer Father     basal and squamous  . Rheum arthritis Father   . Stroke Father   . Glaucoma Father   . Mitral valve prolapse Brother   . Skin cancer Sister   . Other Sister     carcinosarcoma  . Glaucoma Mother   . Hypertension Mother     Social History   Social History  . Marital status: Married    Spouse name: N/A  . Number of children: 0  . Years of education: N/A   Occupational History  . Admin [employer]   Social History Main Topics  . Smoking status: Never Smoker  . Smokeless tobacco: Never Used  . Alcohol use No  . Drug use: No  . Sexual activity: Yes   Other Topics Concern  . None   Social History Narrative   Lives with husband.     Review of Systems - See HPI.  All other ROS are negative.  BP 124/84   Pulse 89    Temp 98.7 F (37.1 C) (Oral)   Resp 16   Ht 5\' 6"  (1.676 m)   Wt 163 lb (73.9 kg)   SpO2 98%   BMI 26.31 kg/m   Physical Exam  HENT:  Head: Normocephalic and atraumatic.  Cardiovascular: Normal rate and regular rhythm.   Pulmonary/Chest: Effort normal.  Neurological: She is alert.  Skin: Skin is warm and dry.     Psychiatric: Affect normal.  Vitals reviewed.  Assessment/Plan: 1. Paronychia of great toe of right foot Discussed supportive measures including soaks. Rx Keflex. FU if not improving within 48 hours of antibiotics or if anything worsens.   - cephALEXin (KEFLEX) 500 MG capsule; Take 1 capsule (500 mg total) by mouth 2 (two) times daily.  Dispense: 14 capsule; Refill: 0   Leeanne Rio, [place]

## 2016-10-23 NOTE — Patient Instructions (Signed)
Please take antibiotic as directed. Do the peroxide soaks as discussed, pulling the skin away from the nail bed. Symptoms should gradually resolve with antibiotic.  If anything is not improving or anything worsens, please return immediately.

## 2016-10-23 NOTE — Progress Notes (Signed)
Pre visit review using our clinic review tool, if applicable. No additional management support is needed unless otherwise documented below in the visit note. 

## 2016-12-06 ENCOUNTER — Other Ambulatory Visit: Payer: Self-pay | Admitting: General Practice

## 2016-12-06 MED ORDER — METOPROLOL SUCCINATE ER 25 MG PO TB24: 25.0000 mg | ORAL_TABLET | Freq: Every day | ORAL | 1 refills | Status: DC

## 2016-12-10 ENCOUNTER — Other Ambulatory Visit: Payer: Self-pay | Admitting: General Practice

## 2016-12-10 DIAGNOSIS — I10 Essential (primary) hypertension: Secondary | ICD-10-CM

## 2016-12-10 MED ORDER — LOSARTAN POTASSIUM 50 MG PO TABS: 50.0000 mg | ORAL_TABLET | Freq: Every day | ORAL | 1 refills | Status: DC

## 2016-12-27 ENCOUNTER — Encounter: Payer: 59 | Admitting: Family Medicine

## 2017-01-01 ENCOUNTER — Other Ambulatory Visit: Payer: Self-pay | Admitting: Family Medicine

## 2017-01-01 DIAGNOSIS — I10 Essential (primary) hypertension: Secondary | ICD-10-CM

## 2017-01-11 ENCOUNTER — Encounter: Payer: Self-pay | Admitting: Family Medicine

## 2017-05-28 ENCOUNTER — Other Ambulatory Visit: Payer: Self-pay | Admitting: Family Medicine

## 2017-05-28 DIAGNOSIS — I10 Essential (primary) hypertension: Secondary | ICD-10-CM

## 2017-06-03 ENCOUNTER — Other Ambulatory Visit: Payer: Self-pay | Admitting: General Practice

## 2017-06-03 MED ORDER — METOPROLOL SUCCINATE ER 25 MG PO TB24: 25.0000 mg | ORAL_TABLET | Freq: Every day | ORAL | 0 refills | Status: AC

## 2018-03-25 ENCOUNTER — Encounter: Payer: Self-pay | Admitting: General Practice
